# Patient Record
Sex: Male | Born: 1990 | Race: White | Hispanic: No | Marital: Single | Smoking: Current every day smoker
Health system: Southern US, Community
[De-identification: ages and names within clinical notes are randomized; demographics above are authoritative.]

## PROBLEM LIST (undated history)

## (undated) DIAGNOSIS — I10 Essential (primary) hypertension: Secondary | ICD-10-CM

## (undated) HISTORY — PX: DENTAL SURGERY: SHX609

## (undated) HISTORY — PX: ORIF ANKLE FRACTURE: SUR919

---
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
## 2015-01-16 ENCOUNTER — Encounter (HOSPITAL_COMMUNITY): Payer: Self-pay | Admitting: Emergency Medicine

## 2015-01-16 ENCOUNTER — Emergency Department (HOSPITAL_COMMUNITY)
Admission: EM | Admit: 2015-01-16 | Discharge: 2015-01-16 | Disposition: A | Discharge status: Home / Self Care | Payer: BLUE CROSS/BLUE SHIELD | Source: Home / Self Care | Attending: Family Medicine | Admitting: Family Medicine

## 2015-01-16 DIAGNOSIS — S0502XA Injury of conjunctiva and corneal abrasion without foreign body, left eye, initial encounter: Secondary | ICD-10-CM

## 2015-01-16 MED ORDER — TETRACAINE HCL 0.5 % OP SOLN
OPHTHALMIC | Status: AC
  Filled 2015-01-16: qty 2

## 2015-01-16 NOTE — Discharge Instructions (Signed)
It was a pleasure to see you today for the irritation of your left eye.  As we discussed, I believe this is related to irritation/scratch of the cornea (lining of the eye).   No contact lenses for the next 48 hours; you may use an eye patch as needed if it makes your eye feel better.   If the eye irritation or pain gets more severe despite "resting" the eye (without contacts), I recommend further evaluation, most likely with an eye doctor.

## 2015-01-16 NOTE — ED Notes (Signed)
[facility] a [facility] of irritation to the left eye. The patient stated that he feel asleep with his contacts in and when he was able to remove it he said it feels like a scratch.

## 2015-01-16 NOTE — ED Provider Notes (Signed)
CSN: [id number]     Arrival date & time 01/16/15  1303 History   First MD Initiated Contact with Patient 01/16/15 1405     Chief Complaint  Patient presents with  . Eye Problem   (Consider location/radiation/quality/duration/timing/severity/associated sxs/prior Treatment) Patient is a 24 y.o. male presenting with eye problem. The history is provided by the patient. No language interpreter was used.  Eye Problem Patient presents with complaint of left eye irritation. Patient fell asleep last night on sofa while watching TV, left contact lenses in.  Awoke at 5am this morning and removed R contact without incident, but L contact lens removal resulted in irritation to the left eye. In the morning (9am) he awoke with continued irritation and mild L eye photophobia.  Has been using eyeglasses today.  Snellen vision testing with spectacles performed today.   ROS Denies headaches, denies sensation of foreign body sensation in eye. No pain with extra-ocular movement, no diplopia. No visual disturbances while using spectacles.   History reviewed. No pertinent past medical history. No past surgical history on file. History reviewed. No pertinent family history. Social History  Substance Use Topics  . Smoking status: None  . Smokeless tobacco: None  . Alcohol Use: None    Review of Systems  Allergies  Review of patient's allergies indicates no known allergies.  Home Medications   Prior to Admission medications   Not on File   Meds Ordered and Administered this Visit  Medications - No data to display  BP 118/73 mmHg  Pulse 69  Temp(Src) 98 F (36.7 C) (Oral)  Resp 18  SpO2 98% No data found.   Physical Exam  Constitutional: He appears well-developed and well-nourished.  Eyes: EOM are normal. Pupils are equal, round, and reactive to light. Right eye exhibits no discharge. Left eye exhibits no discharge. No scleral icterus.  EOMI.  Mild erythema of conjunctiva OS.   No fb  visualized in eye with inspection.   Fluorescein eye exam performed, mild corneal defect in superior nasal aspect of eye.    Neck: Normal range of motion. Neck supple.    ED Course  Procedures (including critical care time)  Labs Review Labs Reviewed - No data to display  Imaging Review No results found.   Visual Acuity Review  Right Eye Distance: 20/20 (With Corrective lenses) Left Eye Distance: 20/25 (With Corrective lenses) Bilateral Distance: 20/20 (With Corrective lenses)  Right Eye Near:   Left Eye Near:    Bilateral Near:         MDM   1. Injury of conjunctiva and corneal abrasion of left eye without foreign body, initial encounter    OS corneal abrasion without evidence of foreign body. Relief with tetracaine, which was administered for fluorescein exam. Plan to avoid contact lens use and rest eye for the next 48 hours. If worsening irritation/pain, or worsening vision with spectacles, to seek further evaluation with eyecare specialist.  Patient voices understanding.     Barbaraann BarthelJames O August Longest, MD 01/16/15 [phone]

## 2016-10-29 ENCOUNTER — Emergency Department (HOSPITAL_COMMUNITY)
Admission: EM | Admit: 2016-10-29 | Discharge: 2016-10-29 | Disposition: A | Discharge status: Home / Self Care | Payer: 59 | Attending: Emergency Medicine | Admitting: Emergency Medicine

## 2016-10-29 ENCOUNTER — Encounter (HOSPITAL_COMMUNITY): Payer: Self-pay | Admitting: *Deleted

## 2016-10-29 DIAGNOSIS — Y92099 Unspecified place in other non-institutional residence as the place of occurrence of the external cause: Secondary | ICD-10-CM | POA: Diagnosis not present

## 2016-10-29 DIAGNOSIS — F17210 Nicotine dependence, cigarettes, uncomplicated: Secondary | ICD-10-CM | POA: Insufficient documentation

## 2016-10-29 DIAGNOSIS — R262 Difficulty in walking, not elsewhere classified: Secondary | ICD-10-CM | POA: Insufficient documentation

## 2016-10-29 DIAGNOSIS — W2203XA Walked into furniture, initial encounter: Secondary | ICD-10-CM | POA: Diagnosis not present

## 2016-10-29 DIAGNOSIS — Y998 Other external cause status: Secondary | ICD-10-CM | POA: Insufficient documentation

## 2016-10-29 DIAGNOSIS — S70921A Unspecified superficial injury of right thigh, initial encounter: Secondary | ICD-10-CM | POA: Diagnosis present

## 2016-10-29 DIAGNOSIS — S7011XA Contusion of right thigh, initial encounter: Secondary | ICD-10-CM | POA: Diagnosis not present

## 2016-10-29 DIAGNOSIS — S7010XA Contusion of unspecified thigh, initial encounter: Secondary | ICD-10-CM

## 2016-10-29 DIAGNOSIS — Y9301 Activity, walking, marching and hiking: Secondary | ICD-10-CM | POA: Insufficient documentation

## 2016-10-29 LAB — COMPREHENSIVE METABOLIC PANEL
ALK PHOS: 55 U/L (ref 38–126)
ALT: 14 U/L — AB (ref 17–63)
AST: 21 U/L (ref 15–41)
Albumin: 4.2 g/dL (ref 3.5–5.0)
Anion gap: 7 (ref 5–15)
BUN: 9 mg/dL (ref 6–20)
CALCIUM: 9.2 mg/dL (ref 8.9–10.3)
CO2: 25 mmol/L (ref 22–32)
CREATININE: 0.88 mg/dL (ref 0.61–1.24)
Chloride: 109 mmol/L (ref 101–111)
Glucose, Bld: 99 mg/dL (ref 65–99)
Potassium: 4 mmol/L (ref 3.5–5.1)
SODIUM: 141 mmol/L (ref 135–145)
Total Bilirubin: 1.2 mg/dL (ref 0.3–1.2)
Total Protein: 7.9 g/dL (ref 6.5–8.1)

## 2016-10-29 LAB — CBC WITH DIFFERENTIAL/PLATELET
Basophils Absolute: 0 10*3/uL (ref 0.0–0.1)
Basophils Relative: 0 %
Eosinophils Absolute: 0.1 10*3/uL (ref 0.0–0.7)
Eosinophils Relative: 2 %
HCT: 39.5 % (ref 39.0–52.0)
HEMOGLOBIN: 14.3 g/dL (ref 13.0–17.0)
LYMPHS PCT: 27 %
Lymphs Abs: 1.9 10*3/uL (ref 0.7–4.0)
MCH: 30.8 pg (ref 26.0–34.0)
MCHC: 36.2 g/dL — ABNORMAL HIGH (ref 30.0–36.0)
MCV: 85.1 fL (ref 78.0–100.0)
Monocytes Absolute: 0.6 10*3/uL (ref 0.1–1.0)
Monocytes Relative: 9 %
NEUTROS PCT: 62 %
Neutro Abs: 4.4 10*3/uL (ref 1.7–7.7)
Platelets: 188 10*3/uL (ref 150–400)
RBC: 4.64 MIL/uL (ref 4.22–5.81)
RDW: 12.1 % (ref 11.5–15.5)
WBC: 7 10*3/uL (ref 4.0–10.5)

## 2016-10-29 LAB — PROTIME-INR
INR: 1.17
Prothrombin Time: 14.8 seconds (ref 11.4–15.2)

## 2016-10-29 MED ORDER — TRAMADOL HCL 50 MG PO TABS
50.0000 mg | ORAL_TABLET | Freq: Once | ORAL | Status: AC
  Administered 2016-10-29: 50 mg via ORAL
  Filled 2016-10-29: qty 1

## 2016-10-29 MED ORDER — BACITRACIN ZINC 500 UNIT/GM EX OINT
1.0000 "application " | TOPICAL_OINTMENT | Freq: Once | CUTANEOUS | Status: AC
  Administered 2016-10-29: 1 via TOPICAL
  Filled 2016-10-29: qty 0.9

## 2016-10-29 NOTE — ED Provider Notes (Signed)
[facility]-EMERGENCY DEPT Provider Note   CSN: [id number] Arrival date & time: 10/29/16  1010     History   Chief Complaint Chief Complaint  Patient presents with  . Leg Injury  . Wound Check    HPI  Blood pressure 132/86, pulse 86, temperature 98.5 F (36.9 C), temperature source Oral, resp. rate 18, SpO2 99 %.  Gary Sanders is a 26 y.o. male complaining of the right thigh pain which she noticed upon waking this morning. Patient was drinking heavily last evening (8 beers and several shots, he states her member walk walking by the couch at his home and feeling a distinct pain. When he woke up this morning he noticed a large bruise to the anterior proximal thigh and he states that the pain is so severe that he is having difficulty bearing weight. No pain medication taken prior to arrival. He denies any pain in the hip or knee joints. He states that he can fully move all the joints without issue. Last tetanus shot is unknown: he declines any shots here today.   History reviewed. No pertinent past medical history.  There are no active problems to display for this patient.   History reviewed. No pertinent surgical history.     Home Medications    Prior to Admission medications   Not on File    Family History No family history on file.  Social History Social History  Substance Use Topics  . Smoking status: Current Every Day Smoker  . Smokeless tobacco: Never Used  . Alcohol use Yes     Comment: occ     Allergies   Amoxicillin   Review of Systems Review of Systems  A complete review of systems was obtained and all systems are negative except as noted in the HPI and PMH.   Physical Exam Updated Vital Signs BP 128/73   Pulse 70   Temp 98 F (36.7 C) (Oral)   Resp 16   SpO2 97%   Physical Exam  Constitutional: He is oriented to person, place, and time. He appears well-developed and well-nourished. No distress.  HENT:  Head: Normocephalic and atraumatic.    Mouth/Throat: Oropharynx is clear and moist.  Eyes: Pupils are equal, round, and reactive to light. Conjunctivae and EOM are normal.  Neck: Normal range of motion.  Cardiovascular: Normal rate, regular rhythm and intact distal pulses.   Pulmonary/Chest: Effort normal and breath sounds normal.  Abdominal: Soft. There is no tenderness.  Musculoskeletal: Normal range of motion. He exhibits tenderness.  Small area of ecchymosis to right anterior thigh as pictured. There is a puncture wound centrally located. Compartments soft, distally neurovascularly intact with DP and PT pulses 2+, excellent range of motion and sensation to toes. There is no mottling to the distal extremity.  Neurological: He is alert and oriented to person, place, and time. No cranial nerve deficit.  Skin: He is not diaphoretic.  Psychiatric: He has a normal mood and affect.  Nursing note and vitals reviewed.          ED Treatments / Results  Labs (all labs ordered are listed, but only abnormal results are displayed) Labs Reviewed  CBC WITH DIFFERENTIAL/PLATELET - Abnormal; Notable for the following:       Result Value   MCHC 36.2 (*)    All other components within normal limits  COMPREHENSIVE METABOLIC PANEL - Abnormal; Notable for the following:    ALT 14 (*)    All other components within normal limits  PROTIME-INR    EKG  EKG Interpretation None       Radiology No results found.  Procedures Procedures (including critical care time)  Medications Ordered in ED Medications  bacitracin ointment 1 application (1 application Topical Given by Other 10/29/16 1317)  traMADol (ULTRAM) tablet 50 mg (50 mg Oral Given 10/29/16 1317)     Initial Impression / Assessment and Plan / ED Course  I have reviewed the triage vital signs and the nursing notes.  Pertinent labs & imaging results that were available during my care of the patient were reviewed by me and considered in my medical decision making (see  chart for details).     Vitals:   10/29/16 1124 10/29/16 1430  BP: 132/86 128/73  Pulse: 86 70  Resp: 18 16  Temp: 98.5 F (36.9 C) 98 F (36.7 C)  TempSrc: Oral Oral  SpO2: 99% 97%    Medications  bacitracin ointment 1 application (1 application Topical Given by Other 10/29/16 1317)  traMADol (ULTRAM) tablet 50 mg (50 mg Oral Given 10/29/16 1317)    [name] is 26 y.o. male presenting with painful ecchymoses to right anterior thigh, triage note says that there is mottling to the extremity however this is in accurate, CP photographs and physical exam documentation. Patient with excellent distal pulses, compartments are soft. Very mild bruising to the anterior thigh. Blood work with no significant abnormality, patient ambulatory but with mild pain. Advised him on how to treat ecchymoses and we've had an extensive discussion of return precautions  Evaluation does not show pathology that would require ongoing emergent intervention or inpatient treatment. Pt is hemodynamically stable and mentating appropriately. Discussed findings and plan with patient/guardian, who agrees with care plan. All questions answered. Return precautions discussed and outpatient follow up given.      Final Clinical Impressions(s) / ED Diagnoses   Final diagnoses:  Contusion of anterior thigh, initial encounter    New Prescriptions There are no discharge medications for this patient.    Kaylyn Lim 10/29/16 1535    Loren Racer, MD 11/05/16 [phone]

## 2016-10-29 NOTE — ED Triage Notes (Signed)
Pt is here with right upper thigh pain and states he was in his house last nite and felt something hit his leg.  No bleeding seen he said and no gunshots heard.  Pt has large bruise to right upper thigh with a black dot in the middle and extremely tender to touch and states he cannot stand on his leg.  Right foot appears mottled, but has a palpable pulse.

## 2016-10-29 NOTE — Discharge Instructions (Signed)
Apply ice packs to the bruised area for the first 48 hours, after that you can transition to heat, this will help to break up the blood collection.  For pain control please take ibuprofen (also known as Motrin or Advil)  (this is normally 4 over the counter pills) 3 times a day  for 5 days. Take with food to minimize stomach irritation.  Please follow with your primary care doctor in the next 2 days for a check-up. They must obtain records for further management.   Do not hesitate to return to the Emergency Department for any new, worsening or concerning symptoms.

## 2023-11-04 ENCOUNTER — Ambulatory Visit (HOSPITAL_COMMUNITY)
Admission: EM | Admit: 2023-11-04 | Discharge: 2023-11-05 | Disposition: A | Discharge status: Other Acute Inpatient Hospital | Attending: Physician Assistant | Admitting: Physician Assistant

## 2023-11-04 DIAGNOSIS — F22 Delusional disorders: Secondary | ICD-10-CM | POA: Insufficient documentation

## 2023-11-04 DIAGNOSIS — Z046 Encounter for general psychiatric examination, requested by authority: Secondary | ICD-10-CM

## 2023-11-04 DIAGNOSIS — R462 Strange and inexplicable behavior: Secondary | ICD-10-CM

## 2023-11-04 LAB — CBC WITH DIFFERENTIAL/PLATELET
Abs Immature Granulocytes: 0.03 K/uL (ref 0.00–0.07)
Basophils Absolute: 0.1 K/uL (ref 0.0–0.1)
Basophils Relative: 1 %
Eosinophils Absolute: 0.1 K/uL (ref 0.0–0.5)
Eosinophils Relative: 1 %
HCT: 38.6 % — ABNORMAL LOW (ref 39.0–52.0)
Hemoglobin: 14.3 g/dL (ref 13.0–17.0)
Immature Granulocytes: 0 %
Lymphocytes Relative: 19 %
Lymphs Abs: 1.6 K/uL (ref 0.7–4.0)
MCH: 31.2 pg (ref 26.0–34.0)
MCHC: 37 g/dL — ABNORMAL HIGH (ref 30.0–36.0)
MCV: 84.3 fL (ref 80.0–100.0)
Monocytes Absolute: 0.5 K/uL (ref 0.1–1.0)
Monocytes Relative: 6 %
Neutro Abs: 6.1 K/uL (ref 1.7–7.7)
Neutrophils Relative %: 73 %
Platelets: 210 K/uL (ref 150–400)
RBC: 4.58 MIL/uL (ref 4.22–5.81)
RDW: 11.6 % (ref 11.5–15.5)
WBC: 8.2 K/uL (ref 4.0–10.5)
nRBC: 0 % (ref 0.0–0.2)

## 2023-11-04 LAB — HEMOGLOBIN A1C
Hgb A1c MFr Bld: 4.6 % — ABNORMAL LOW (ref 4.8–5.6)
Mean Plasma Glucose: 85.32 mg/dL

## 2023-11-04 LAB — LIPID PANEL
Cholesterol: 140 mg/dL (ref 0–200)
HDL: 51 mg/dL (ref >40)
LDL Cholesterol: 84 mg/dL (ref 0–99)
Total CHOL/HDL Ratio: 2.7 ratio
Triglycerides: 26 mg/dL (ref <150)
VLDL: 5 mg/dL (ref 0–40)

## 2023-11-04 LAB — POCT URINE DRUG SCREEN - MANUAL ENTRY (I-SCREEN)
POC Amphetamine UR: NOT DETECTED
POC Buprenorphine (BUP): NOT DETECTED
POC Cocaine UR: NOT DETECTED
POC Marijuana UR: POSITIVE — AB
POC Methadone UR: NOT DETECTED
POC Methamphetamine UR: NOT DETECTED
POC Morphine: NOT DETECTED
POC Oxazepam (BZO): NOT DETECTED
POC Oxycodone UR: NOT DETECTED
POC Secobarbital (BAR): NOT DETECTED

## 2023-11-04 LAB — ETHANOL: Alcohol, Ethyl (B): <15 mg/dL (ref <15)

## 2023-11-04 LAB — COMPREHENSIVE METABOLIC PANEL WITH GFR
ALT: 14 U/L (ref 0–44)
AST: 16 U/L (ref 15–41)
Albumin: 4.5 g/dL (ref 3.5–5.0)
Alkaline Phosphatase: 37 U/L — ABNORMAL LOW (ref 38–126)
Anion gap: 16 — ABNORMAL HIGH (ref 5–15)
BUN: 15 mg/dL (ref 6–20)
CO2: 22 mmol/L (ref 22–32)
Calcium: 9.6 mg/dL (ref 8.9–10.3)
Chloride: 99 mmol/L (ref 98–111)
Creatinine, Ser: 1.17 mg/dL (ref 0.61–1.24)
GFR, Estimated: >60 mL/min (ref >60)
Glucose, Bld: 83 mg/dL (ref 70–99)
Potassium: 4.3 mmol/L (ref 3.5–5.1)
Sodium: 137 mmol/L (ref 135–145)
Total Bilirubin: 1.4 mg/dL — ABNORMAL HIGH (ref 0.0–1.2)
Total Protein: 6.9 g/dL (ref 6.5–8.1)

## 2023-11-04 LAB — TSH: TSH: 1.644 u[IU]/mL (ref 0.350–4.500)

## 2023-11-04 MED ORDER — HYDROXYZINE HCL 25 MG PO TABS: 25.0000 mg | ORAL_TABLET | Freq: Three times a day (TID) | ORAL | Status: DC | PRN

## 2023-11-04 MED ORDER — HALOPERIDOL 5 MG PO TABS
5.0000 mg | ORAL_TABLET | Freq: Three times a day (TID) | ORAL | Status: DC | PRN
  Filled 2023-11-04: qty 1

## 2023-11-04 MED ORDER — ACETAMINOPHEN 325 MG PO TABS: 650.0000 mg | ORAL_TABLET | Freq: Four times a day (QID) | ORAL | Status: DC | PRN

## 2023-11-04 MED ORDER — MAGNESIUM HYDROXIDE 400 MG/5ML PO SUSP: 30.0000 mL | Freq: Every day | ORAL | Status: DC | PRN

## 2023-11-04 MED ORDER — LORAZEPAM 2 MG/ML IJ SOLN: 2.0000 mg | Freq: Three times a day (TID) | INTRAMUSCULAR | Status: DC | PRN

## 2023-11-04 MED ORDER — HALOPERIDOL LACTATE 5 MG/ML IJ SOLN: 10.0000 mg | Freq: Three times a day (TID) | INTRAMUSCULAR | Status: DC | PRN

## 2023-11-04 MED ORDER — DIPHENHYDRAMINE HCL 50 MG/ML IJ SOLN: 50.0000 mg | Freq: Three times a day (TID) | INTRAMUSCULAR | Status: DC | PRN

## 2023-11-04 MED ORDER — TRAZODONE HCL 50 MG PO TABS
50.0000 mg | ORAL_TABLET | Freq: Every evening | ORAL | Status: DC | PRN
  Administered 2023-11-04: 50 mg via ORAL
  Filled 2023-11-04: qty 1

## 2023-11-04 MED ORDER — ALUM & MAG HYDROXIDE-SIMETH 200-200-20 MG/5ML PO SUSP: 30.0000 mL | ORAL | Status: DC | PRN

## 2023-11-04 MED ORDER — DIPHENHYDRAMINE HCL 50 MG PO CAPS
50.0000 mg | ORAL_CAPSULE | Freq: Three times a day (TID) | ORAL | Status: DC | PRN
  Filled 2023-11-04: qty 1

## 2023-11-04 MED ORDER — HALOPERIDOL LACTATE 5 MG/ML IJ SOLN: 5.0000 mg | Freq: Three times a day (TID) | INTRAMUSCULAR | Status: DC | PRN

## 2023-11-04 NOTE — ED Notes (Signed)
 Patient IVC oriented to unit.  Patient initially anxious and hesitant about lying in bed.  Staff offered support and encouragement.  Patient receptive.  Patient currently endorses SI but agrees to contract.  No HI/AVH.  Q15 minute checks performed for patient safety.

## 2023-11-04 NOTE — ED Provider Notes (Signed)
 [facility] Continuous Assessment Admission H&P  Date: 11/04/23 Patient Name: Gary Sanders MRN: [id number] Chief Complaint: IVC by mother and step mother  Diagnoses:  Final diagnoses:  Involuntary commitment  Bizarre behavior    HPI:   Gary Sanders is a 33 year old male with a past psychiatric history significant for depression who presents to [facility] involuntarily by [employer].  Patient reports that he was IVC'd by his mother and step mother.  Patient reports that his mother and stepmother did not tell him why he was being committed.  When asked how he was behaving prior to his admission, patient reported that he is not exhibiting any behavior beyond his true nature.  He reports that some of the behaviors that he was exhibiting today do not align with who he is.  He reports that he was out of character prior to being picked up by [employer].  He reports that he was not calm and composed prior to being brought in and was acting erratically.  Patient denies acting in a way that was a cause for concern.  Patient affidavit and petition reads the following:  RESPONDENT IS TALKING IN THIRD PERSON. SPEAKING ABOUT JUMPING OFF A BALCONY AND BANGING HIS HEAD AGAINST A WINDOW AND GLASS. TELLING SOMEONE WHO IS NOT THERE TO STOP TALKING TO HIM. HAS HAD SHAKES, TURNED PALE, SWEATING, PASSED OUT, AND THEN JUMPED UP FROM BEING PASSED OUT DURING THE DAY. TOLD FAMILY HIS BRACELET IS ON FIRE BUT IT IS NOT. ACTING AS IF HE IS HOLDING A TEACUP STRAIGHT OUT IN HIS HAND TALKING TO SOMEONE. IN THIRD PERSON IS SPEAKING OF TAKING HIS EYES INTO THE KNIFE.  Patient denies depression but does endorse anxiety due to schoolwork and assignments that he has to do.  Patient denies paranoia and further denies auditory or visual hallucinations.  Patient denies any other issues or concerns at this time.  Patient endorses a past history of depression and states that he was placed on sertraline and  trazodone in 2021.  He reports that he took sertraline until fall of 2023 when he was evaluated [facility].  He reports that his primary care provider encouraged him to see a counselor and once he started seeing a counselor, patient was taken off his sertraline.  Patient reports that he has felt better ever since coming off her sertraline.  Patient reports that he still receives trazodone by his primary care provider.  Patient denies a past history of hospitalization due to mental health.  Patient denies a past history of suicide attempt.  Patient denies a past history of self-harm.  Patient is alert oriented x 4, calm, cooperative, and engaged in conversation during the encounter.  Patient's speech is clear, coherent, and with normal rate.  Patient maintains fair eye contact.  Patient's thought process is coherent.  Patient's thought content is delusional per IVC paperwork.  Patient exhibits anxious mood with blunted affect.  Patient denies suicidal or homicidal ideations.  He further denies auditory or visual hallucinations and does not appear to be responding to internal/external stimuli.  Patient endorses good sleep and receives on average 6 to 8 hours of sleep per night.  Patient endorses decreased appetite and eats on average 1 meal per day.  Patient denies alcohol consumption, tobacco use, or illicit drug use.  He reports that he does receive THC-A from online.  Total Time spent with patient: 30 minutes  Musculoskeletal  Strength &  Muscle Tone: within normal limits Gait & Station: normal Patient leans: N/A  Psychiatric Specialty Exam  Presentation General Appearance:  Casual  Eye Contact: Fair  Speech: Clear and Coherent; Normal Rate  Speech Volume: Normal  Handedness: -- (Not applicable)   Mood and Affect  Mood: Anxious; Dysphoric  Affect: Blunt   Thought Process  Thought Processes: Coherent  Descriptions of  Associations:Intact  Orientation:Full (Time, Place and Person)  Thought Content:Delusions (Per IVC paperwork, patient was talking in third person.  He was telling someone who was not there to stop talking to him.)  Diagnosis of Schizophrenia or Schizoaffective disorder in past: No  Duration of Psychotic Symptoms: Less than six months  Hallucinations:Hallucinations: Auditory Description of Auditory Hallucinations: Per IVC paperwork, patient was telling someone who is not there to stop talking to him.  Ideas of Reference:Delusions (Per IVC paperwork, patient was talking in third person. He was telling someone who was not there to stop talking to him.)  Suicidal Thoughts:Suicidal Thoughts: Yes, Active (Patient reports that he was speaking about jumping off a balcony and banged his head against the window and glass.) SI Active Intent and/or Plan: With Intent; With Plan  Homicidal Thoughts:Homicidal Thoughts: No   Sensorium  Memory: Immediate Fair; Remote Fair; Recent Fair  Judgment: Impaired  Insight: Lacking   Executive Functions  Concentration: Fair  Attention Span: Fair  Recall: Fair  Fund of Knowledge: Fair  Language: Good   Psychomotor Activity  Psychomotor Activity: Psychomotor Activity: Normal   Assets  Assets: Communication Skills; Housing   Sleep  Sleep: Sleep: Good Number of Hours of Sleep: 7   Nutritional Assessment (For OBS and [facility] admissions only) Has the patient had a weight loss or gain of 10 pounds or more in the last 3 months?: No Has the patient had a decrease in food intake/or appetite?: Yes Does the patient have dental problems?: No Does the patient have eating habits or behaviors that may be indicators of an eating disorder including binging or inducing vomiting?: No Has the patient recently lost weight without trying?: 0 Has the patient been eating poorly because of a decreased appetite?: 0 Malnutrition Screening Tool Score:  0    Physical Exam Constitutional:      Appearance: Normal appearance.  HENT:     Head: Normocephalic and atraumatic.     Nose: Nose normal.  Eyes:     Extraocular Movements: Extraocular movements intact.  Cardiovascular:     Rate and Rhythm: Normal rate and regular rhythm.  Pulmonary:     Effort: Pulmonary effort is normal.  Musculoskeletal:        General: Normal range of motion.     Cervical back: Normal range of motion.  Skin:    General: Skin is warm and dry.  Neurological:     Mental Status: He is alert.  Psychiatric:        Attention and Perception: Attention normal. He does not perceive auditory or visual hallucinations.        Mood and Affect: Mood is anxious. Affect is blunt.        Speech: Speech normal.        Behavior: Behavior normal. Behavior is cooperative.        Thought Content: Thought content is not paranoid or delusional. Thought content does not include homicidal or suicidal ideation.        Judgment: Judgment is inappropriate.     Comments: Per IVC paperwork, patient was talking in third person and telling  someone who is not there to stop talking to him.  Prior to his admission, patient was observed speaking about jumping off a balcony and breaking his head against a window and glass.    Review of Systems  Constitutional: Negative.   HENT: Negative.    Eyes: Negative.   Respiratory: Negative.    Cardiovascular: Negative.   Gastrointestinal: Negative.   Skin: Negative.   Neurological: Negative.   Psychiatric/Behavioral:  Negative for depression, hallucinations, substance abuse and suicidal ideas. The patient is nervous/anxious. The patient does not have insomnia.     Blood pressure 132/84, pulse 88, temperature 98.8 F (37.1 C), temperature source Oral, height 5' 11 (1.803 m), weight 205 lb (93 kg), SpO2 97%. Body mass index is 28.59 kg/m.  Past Psychiatric History:   Patient has a past history of major depressive disorder (severe episode,  recurrent, without psychotic features) and adjustment disorder  Is the patient at risk to self? Yes  Has the patient been a risk to self in the past 6 months? No .    Has the patient been a risk to self within the distant past? No   Is the patient a risk to others? No   Has the patient been a risk to others in the past 6 months? No   Has the patient been a risk to others within the distant past? No   Past Medical History:  Unknown  Family History:  Unknown  Social History:  Not applicable  Last Labs:  No visits with results within 6 Month(s) from this visit.  Latest known visit with results is:  Admission on 10/29/2016, Discharged on 10/29/2016  Component Date Value Ref Range Status   WBC 10/29/2016 7.0  4.0 - 10.5 K/uL Final   RBC 10/29/2016 4.64  4.22 - 5.81 MIL/uL Final   Hemoglobin 10/29/2016 14.3  13.0 - 17.0 g/dL Final   HCT 90/76/7981 39.5  39.0 - 52.0 % Final   MCV 10/29/2016 85.1  78.0 - 100.0 fL Final   MCH 10/29/2016 30.8  26.0 - 34.0 pg Final   MCHC 10/29/2016 36.2 (H)  30.0 - 36.0 g/dL Final   RDW 90/76/7981 12.1  11.5 - 15.5 % Final   Platelets 10/29/2016 188  150 - 400 K/uL Final   Neutrophils Relative % 10/29/2016 62  % Final   Neutro Abs 10/29/2016 4.4  1.7 - 7.7 K/uL Final   Lymphocytes Relative 10/29/2016 27  % Final   Lymphs Abs 10/29/2016 1.9  0.7 - 4.0 K/uL Final   Monocytes Relative 10/29/2016 9  % Final   Monocytes Absolute 10/29/2016 0.6  0.1 - 1.0 K/uL Final   Eosinophils Relative 10/29/2016 2  % Final   Eosinophils Absolute 10/29/2016 0.1  0.0 - 0.7 K/uL Final   Basophils Relative 10/29/2016 0  % Final   Basophils Absolute 10/29/2016 0.0  0.0 - 0.1 K/uL Final   Sodium 10/29/2016 141  135 - 145 mmol/L Final   Potassium 10/29/2016 4.0  3.5 - 5.1 mmol/L Final   Chloride 10/29/2016 109  101 - 111 mmol/L Final   CO2 10/29/2016 25  22 - 32 mmol/L Final   Glucose, Bld 10/29/2016 99  65 - 99 mg/dL Final   BUN 90/76/7981 9  6 - 20 mg/dL Final    Creatinine, Ser 10/29/2016 0.88  0.61 - 1.24 mg/dL Final   Calcium 90/76/7981 9.2  8.9 - 10.3 mg/dL Final   Total Protein 90/76/7981 7.9  6.5 - 8.1 g/dL Final  Albumin 10/29/2016 4.2  3.5 - 5.0 g/dL Final   AST 90/76/7981 21  15 - 41 U/L Final   ALT 10/29/2016 14 (L)  17 - 63 U/L Final   Alkaline Phosphatase 10/29/2016 55  38 - 126 U/L Final   Total Bilirubin 10/29/2016 1.2  0.3 - 1.2 mg/dL Final   GFR calc non Af Amer 10/29/2016 >60  >60 mL/min Final   GFR calc Af Amer 10/29/2016 >60  >60 mL/min Final   Comment: (NOTE) The eGFR has been calculated using the CKD EPI equation. This calculation has not been validated in all clinical situations. eGFR's persistently <60 mL/min signify possible Chronic Kidney Disease.    Anion gap 10/29/2016 7  5 - 15 Final   Prothrombin Time 10/29/2016 14.8  11.4 - 15.2 seconds Final   INR 10/29/2016 1.17   Final    Allergies: Amoxicillin  Medications:  Facility Ordered Medications  Medication   acetaminophen (TYLENOL) tablet 650 mg   alum & mag hydroxide-simeth (MAALOX/MYLANTA) 200-200-20 MG/5ML suspension 30 mL   magnesium hydroxide (MILK OF MAGNESIA) suspension 30 mL   haloperidol (HALDOL) tablet 5 mg   And   diphenhydrAMINE (BENADRYL) capsule 50 mg   haloperidol lactate (HALDOL) injection 5 mg   And   diphenhydrAMINE (BENADRYL) injection 50 mg   And   LORazepam (ATIVAN) injection 2 mg   haloperidol lactate (HALDOL) injection 10 mg   And   diphenhydrAMINE (BENADRYL) injection 50 mg   And   LORazepam (ATIVAN) injection 2 mg   hydrOXYzine (ATARAX) tablet 25 mg   traZODone (DESYREL) tablet 50 mg      Medical Decision Making  Patient was brought into the facility involuntarily by [employer].  Patient reports that he was committed by his mother and stepmother.  When asked why he was admitted, patient reports that he was acting erratically and was not calm or composed.  Patient did not mention any of the details reported in the affidavit and  petition forms.  Per affidavit:  RESPONDENT IS TALKING IN THIRD PERSON. SPEAKING ABOUT JUMPING OFF A BALCONY AND BANGING HIS HEAD AGAINST A WINDOW AND GLASS. TELLING SOMEONE WHO IS NOT THERE TO STOP TALKING TO HIM. HAS HAD SHAKES, TURNED PALE, SWEATING, PASSED OUT, AND THEN JUMPED UP FROM BEING PASSED OUT DURING THE DAY. TOLD FAMILY HIS BRACELET IS ON FIRE BUT IT IS NOT. ACTING AS IF HE IS HOLDING A TEACUP STRAIGHT OUT IN HIS HAND TALKING TO SOMEONE. IN THIRD PERSON IS SPEAKING OF TAKING HIS EYES INTO THE KNIFE.  Based on my evaluation of the patient and what was read in the IVC paperwork, provider recommends inpatient psychiatry for the patient.  Prior to being admitted onto the unit, provider to order and initiate admission labs.  Recommendations  Based on my evaluation the patient does not appear to have an emergency medical condition. Disposition: Inpatient psychiatry  Reginia FORBES Bolster, [place] 11/04/23  9:49 PM

## 2023-11-04 NOTE — ED Notes (Signed)
D: Pt resting, eyes closed, respirations even and unlabored. No distress noted. A: Continue Q 15 min checks for safety. R: Pt remains safe on the unit.   

## 2023-11-05 ENCOUNTER — Other Ambulatory Visit: Payer: Self-pay

## 2023-11-05 ENCOUNTER — Inpatient Hospital Stay (HOSPITAL_COMMUNITY)
Admission: AD | Admit: 2023-11-05 | Discharge: 2023-11-11 | DRG: 885 | Disposition: A | Discharge status: Home / Self Care | Source: Intra-hospital

## 2023-11-05 ENCOUNTER — Encounter (HOSPITAL_COMMUNITY): Payer: Self-pay

## 2023-11-05 DIAGNOSIS — F29 Unspecified psychosis not due to a substance or known physiological condition: Principal | ICD-10-CM | POA: Diagnosis present

## 2023-11-05 DIAGNOSIS — F19950 Other psychoactive substance use, unspecified with psychoactive substance-induced psychotic disorder with delusions: Principal | ICD-10-CM | POA: Diagnosis present

## 2023-11-05 DIAGNOSIS — F1220 Cannabis dependence, uncomplicated: Secondary | ICD-10-CM | POA: Diagnosis not present

## 2023-11-05 DIAGNOSIS — I10 Essential (primary) hypertension: Secondary | ICD-10-CM | POA: Diagnosis present

## 2023-11-05 DIAGNOSIS — F12250 Cannabis dependence with psychotic disorder with delusions: Secondary | ICD-10-CM | POA: Diagnosis present

## 2023-11-05 DIAGNOSIS — F17200 Nicotine dependence, unspecified, uncomplicated: Secondary | ICD-10-CM | POA: Diagnosis present

## 2023-11-05 DIAGNOSIS — F12959 Cannabis use, unspecified with psychotic disorder, unspecified: Secondary | ICD-10-CM

## 2023-11-05 DIAGNOSIS — Z79899 Other long term (current) drug therapy: Secondary | ICD-10-CM | POA: Diagnosis not present

## 2023-11-05 DIAGNOSIS — Z9152 Personal history of nonsuicidal self-harm: Secondary | ICD-10-CM | POA: Diagnosis not present

## 2023-11-05 HISTORY — DX: Cannabis use, unspecified with psychotic disorder, unspecified: F12.959

## 2023-11-05 HISTORY — DX: Essential (primary) hypertension: I10

## 2023-11-05 MED ORDER — HYDROXYZINE HCL 25 MG PO TABS
25.0000 mg | ORAL_TABLET | Freq: Three times a day (TID) | ORAL | Status: DC | PRN
  Filled 2023-11-05 (×2): qty 1

## 2023-11-05 MED ORDER — LORAZEPAM 2 MG/ML IJ SOLN: 2.0000 mg | Freq: Three times a day (TID) | INTRAMUSCULAR | Status: DC | PRN

## 2023-11-05 MED ORDER — SERTRALINE HCL 50 MG PO TABS
50.0000 mg | ORAL_TABLET | Freq: Every day | ORAL | Status: DC
  Filled 2023-11-05: qty 1

## 2023-11-05 MED ORDER — DIPHENHYDRAMINE HCL 50 MG/ML IJ SOLN: 50.0000 mg | Freq: Three times a day (TID) | INTRAMUSCULAR | Status: DC | PRN

## 2023-11-05 MED ORDER — TRAZODONE HCL 50 MG PO TABS
50.0000 mg | ORAL_TABLET | Freq: Every evening | ORAL | Status: DC | PRN
  Administered 2023-11-05: 50 mg via ORAL
  Filled 2023-11-05: qty 1

## 2023-11-05 MED ORDER — QUETIAPINE FUMARATE 50 MG PO TABS
50.0000 mg | ORAL_TABLET | Freq: Two times a day (BID) | ORAL | Status: DC
  Filled 2023-11-05: qty 1

## 2023-11-05 MED ORDER — HALOPERIDOL LACTATE 5 MG/ML IJ SOLN: 10.0000 mg | Freq: Three times a day (TID) | INTRAMUSCULAR | Status: DC | PRN

## 2023-11-05 MED ORDER — LORAZEPAM 0.5 MG PO TABS
0.5000 mg | ORAL_TABLET | Freq: Four times a day (QID) | ORAL | Status: DC | PRN
  Administered 2023-11-05: 0.5 mg via ORAL
  Filled 2023-11-05: qty 1

## 2023-11-05 MED ORDER — DIPHENHYDRAMINE HCL 25 MG PO CAPS: 50.0000 mg | ORAL_CAPSULE | Freq: Three times a day (TID) | ORAL | Status: DC | PRN

## 2023-11-05 MED ORDER — ALUM & MAG HYDROXIDE-SIMETH 200-200-20 MG/5ML PO SUSP: 30.0000 mL | ORAL | Status: DC | PRN

## 2023-11-05 MED ORDER — HALOPERIDOL 5 MG PO TABS: 5.0000 mg | ORAL_TABLET | Freq: Three times a day (TID) | ORAL | Status: DC | PRN

## 2023-11-05 MED ORDER — MAGNESIUM HYDROXIDE 400 MG/5ML PO SUSP: 30.0000 mL | Freq: Every day | ORAL | Status: DC | PRN

## 2023-11-05 MED ORDER — ACETAMINOPHEN 325 MG PO TABS
650.0000 mg | ORAL_TABLET | Freq: Four times a day (QID) | ORAL | Status: DC | PRN
  Administered 2023-11-06 – 2023-11-07 (×3): 650 mg via ORAL
  Filled 2023-11-05 (×3): qty 2

## 2023-11-05 MED ORDER — DIPHENHYDRAMINE HCL 50 MG PO CAPS: 50.0000 mg | ORAL_CAPSULE | Freq: Three times a day (TID) | ORAL | Status: DC | PRN

## 2023-11-05 MED ORDER — HALOPERIDOL LACTATE 5 MG/ML IJ SOLN: 5.0000 mg | Freq: Three times a day (TID) | INTRAMUSCULAR | Status: DC | PRN

## 2023-11-05 NOTE — BH Assessment (Signed)
 Comprehensive Clinical Assessment (CCA) Note   11/05/2023 Gary Sanders [id number]  Disposition: Per [facility],  patient is recommended for inpatient admission.  Disposition SW to pursue appropriate inpatient options.  The patient demonstrates the following risk factors for suicide: Chronic risk factors for suicide include: psychiatric disorder of Major Depressive Disorder. Acute risk factors for suicide include: family or marital conflict. Protective factors for this patient include: positive social support. Considering these factors, the overall suicide risk at this point appears to be moderate. Patient is not appropriate for outpatient follow up.   Patient is a 33 year old male with a history of depression who presents involuntarily to [facility] for an assessment. Patient resides in the home alone and identifies his mother as their primary support system.Patient reports isolation, crying spells, irritability, hopelessness, guilt, loss of interest to do things they enjoy, fatigue, lack of concentration, worthlessness, change in sleep, change in appetite. Patient denies history of past suicide attempts. Patient has a hx of Substance Abuse: THC 8  Last use was earlier today .Patient denies NSSIB, SI, HI, AVH.  According to IVC : RESPONDENT IS TALKING IN THIRD PERSON. SPEAKING ABOUT JUMPING OFF A BALCONY AND BANGING HIS HEAD AGAINST A WINDOW AND GLASS. TELLING SOMEONE WHO IS NOT THERE TO STOP TALKING TO HIM. HAS HAD SHAKES, TURNED PALE, SWEATING, PASSED OUT, AND THEN JUMPED UP FROM BEING PASSED OUT DURING THE DAY. TOLD FAMILY HIS BRACELET IS ON FIRE BUT IT IS NOT. ACTING AS IF HE IS HOLDING A TEACUP STRAIGHT OUT IN HIS HAND TALKING TO SOMEONE. IN THIRD PERSON IS SPEAKING OF TAKING HIS EYES INTO THE KNIFE.   Patient identifies his primary stressors as his relationship with her mother. Patient denies history of abuse or trauma. Patient denies current legal problems. Patient is  not receiving outpatient therapy and psychiatry services. Patient reports he  takes his medications as prescribed (see MAR) and denies recent medication changes. Pt reports that he got off Sertraline two years ago and only takes Trazodone. Patient denies previous inpatient admission.  Patient denies access to weapons.   Patient is able to to contract for safety outside of the hospital.  Patient gives verbal consent for [facility] to contact his mother. Clinician attempted to contact pt's mother twice and did not get a response. Clinician left a HIPAA compliant voice mail message.  During evaluation pt is in no acute distress. He is alert, oriented x 4, calm, cooperative and attentive. his mood is closed / guarded, depressed, and flat with congruent affect. He has normal speech, and behavior.  Objectively there is no evidence of psychosis/mania or delusional thinking.  Patient is able to converse coherently, goal directed thoughts, no distractibility, or pre-occupation.   He also denies suicidal/self-harm/homicidal ideation, psychosis, and paranoia.  Patient answered question appropriately.       Chief Complaint: IVC  Visit Diagnosis:  Involuntary commitment Bizarre behavior     CCA Screening, Triage and Referral (STR)  Patient Reported Information How did you hear about us ? Legal System  What Is the Reason for Your Visit/Call Today? Pt is a 33 yo male who was brought to [facility] by [facility] involuntarily. Pt reports that he is unsure why his mother put IVC paperwork on him. Pt was poor historian. Pt appeared to have slow speech. Pt denies SI, HI, and AVH. Pt reports hx of depression. Pt reports he only takes trazadone. Pt is currently not active with outpatient services.  How Long Has This Been Causing  You Problems? 1-6 months  What Do You Feel Would Help You the Most Today? Treatment for Depression or other mood problem; Stress Management; Medication(s)   Have You Recently Had Any Thoughts About  Hurting Yourself? No  Are You Planning to Commit Suicide/Harm Yourself At This time? No   Flowsheet Row ED from 11/04/20[facility] in [facility]  C-SSRS RISK CATEGORY High Risk    Have you Recently Had Thoughts About Hurting Someone [place]? No  Are You Planning to Harm Someone at This Time? No  Explanation: Pt denies HI   Have You Used Any Alcohol or Drugs in the Past 24 Hours? Yes  How Long Ago Did You Use Drugs or Alcohol? Earlier today  What Did You Use and How Much? Pt reports using THC 8 . Pt unable to recall how much he uses. Pt reports he uses daily.   Do You Currently Have a Therapist/Psychiatrist? No  Name of Therapist/Psychiatrist:    Have You Been Recently Discharged From Any Office Practice or Programs? No  Explanation of Discharge From Practice/Program: n/a     CCA Screening Triage Referral Assessment Type of Contact: Face-to-Face  Telemedicine Service Delivery:   Is this Initial or Reassessment?   Date Telepsych consult ordered in [facility]:    Time Telepsych consult ordered in [facility]:    Location of Assessment: [facility]  Provider Location: [facility] Assessment Services   Collateral Involvement: None : TTS attempted to contact mother ( IVC petitioner) twice and did not receive a response.   Does Patient Have a Automotive engineer Guardian? No  Legal Guardian Contact Information: n/a  Copy of Legal Guardianship Form: -- (n/a)  Legal Guardian Notified of Arrival: -- (n/a)  Legal Guardian Notified of Pending Discharge: -- (n/a)  If Minor and Not Living with Parent(s), Who has Custody? n/a  Is CPS involved or ever been involved? Never  Is APS involved or ever been involved? Never   Patient Determined To Be At Risk for Harm To Self or Others Based on Review of Patient Reported Information or Presenting Complaint? No  Method: No Plan  Availability of Means: No access or NA  Intent: Vague intent or  NA  Notification Required: No need or identified person  Additional Information for Danger to Others Potential: -- (n/a)  Additional Comments for Danger to Others Potential: n/a  Are There Guns or Other Weapons in Your Home? No  Types of Guns/Weapons: Pt denies access  Are These Weapons Safely Secured?                            Yes  Who Could Verify You Are Able To Have These Secured: Pt denies access  Do You Have any Outstanding Charges, Pending Court Dates, Parole/Probation? Pt denies pending legal charges  Contacted To Inform of Risk of Harm To Self or Others: Other: Comment (n/a)    Does Patient Present under Involuntary Commitment? Yes    [place] of Residence: [place]   Patient Currently Receiving the Following Services: Not Receiving Services   Determination of Need: Urgent (48 hours)   Options For Referral: Inpatient Hospitalization     CCA Biopsychosocial Patient Reported Schizophrenia/Schizoaffective Diagnosis in Past: No   Strengths: None reported   Mental Health Symptoms Depression:  Change in energy/activity; Hopelessness; Worthlessness   Duration of Depressive symptoms: Duration of Depressive Symptoms: Greater than two weeks   Mania:  None  Anxiety:   None   Psychosis:  None   Duration of Psychotic symptoms: Duration of Psychotic Symptoms: N/A   Trauma:  None   Obsessions:  None   Compulsions:  None   Inattention:  None   Hyperactivity/Impulsivity:  None   Oppositional/Defiant Behaviors:  None   Emotional Irregularity:  None   Other Mood/Personality Symptoms:  none reported    Mental Status Exam Appearance and self-care  Stature:  Average   Weight:  Average weight   Clothing:  Casual   Grooming:  Normal   Cosmetic use:  None   Posture/gait:  Normal   Motor activity:  Not Remarkable   Sensorium  Attention:  Confused   Concentration:  Normal   Orientation:  Person; Place   Recall/memory:  Normal    Affect and Mood  Affect:  Depressed; Flat   Mood:  Depressed   Relating  Eye contact:  Staring   Facial expression:  Tense   Attitude toward examiner:  Cooperative   Thought and Language  Speech flow: Blocked   Thought content:  Appropriate to Mood and Circumstances   Preoccupation:  Suicide (Per IVC. Pt currently denies SI)   Hallucinations:  None   Organization:  Circumstantial   Company secretary of Knowledge:  Fair   Intelligence:  Average   Abstraction:  Abstract   Judgement:  Fair   Dance movement psychotherapist:  Adequate   Insight:  Fair   Decision Making:  Impulsive   Social Functioning  Social Maturity:  Impulsive   Social Judgement:  Heedless   Stress  Stressors:  Family conflict   Coping Ability:  Exhausted; Overwhelmed   Skill Deficits:  Activities of daily living   Supports:  Family     Religion: Religion/Spirituality Are You A Religious Person?: No How Might This Affect Treatment?: n/a  Leisure/Recreation: Leisure / Recreation Do You Have Hobbies?: No  Exercise/Diet: Exercise/Diet Do You Exercise?: No Have You Gained or Lost A Significant Amount of Weight in the Past Six Months?: No Do You Follow a Special Diet?: No Do You Have Any Trouble Sleeping?: No   CCA Employment/Education Employment/Work Situation: Employment / Work Situation Employment Situation: Employed Work Stressors: none reported Patient's Job has Been Impacted by Current Illness: No Has Patient ever Been in Equities trader?: No  Education: Education Is Patient Currently Attending School?: No Last Grade Completed: 12 Did You Product manager?: No Did You Have An Individualized Education Program (IIEP): No Did You Have Any Difficulty At [employer]?: No Patient's Education Has Been Impacted by Current Illness: No   CCA Family/Childhood History Family and Relationship History: Family history Marital status: Single Does patient have children?: No  Childhood  History:  Childhood History By whom was/is the patient raised?: Mother Did patient suffer any verbal/emotional/physical/sexual abuse as a child?: No Did patient suffer from severe childhood neglect?: No Has patient ever been sexually abused/assaulted/raped as an adolescent or adult?: No Was the patient ever a victim of a crime or a disaster?: No Witnessed domestic violence?: No Has patient been affected by domestic violence as an adult?: No       CCA Substance Use Alcohol/Drug Use: Alcohol / Drug Use Pain Medications: See MAR Prescriptions: See MAR Over the Counter: See MAR History of alcohol / drug use?: Yes Longest period of sobriety (when/how long): Pt reports using THC 8 daily but unsure of the amount Negative Consequences of Use:  (n/a) Withdrawal Symptoms:  (n/a)  ASAM's:  Six Dimensions of Multidimensional Assessment  Dimension 1:  Acute Intoxication and/or Withdrawal Potential:      Dimension 2:  Biomedical Conditions and Complications:      Dimension 3:  Emotional, Behavioral, or Cognitive Conditions and Complications:     Dimension 4:  Readiness to Change:     Dimension 5:  Relapse, Continued use, or Continued Problem Potential:     Dimension 6:  Recovery/Living Environment:     ASAM Severity Score:    ASAM Recommended Level of Treatment: ASAM Recommended Level of Treatment:  (n/a)   Substance use Disorder (SUD) Substance Use Disorder (SUD)  Checklist Symptoms of Substance Use:  (n/a)  Recommendations for Services/Supports/Treatments: Recommendations for Services/Supports/Treatments Recommendations For Services/Supports/Treatments: Inpatient Hospitalization  Disposition Recommendation per psychiatric provider: We recommend inpatient psychiatric hospitalization after medical hospitalization. Patient has been involuntarily committed on 11/04/23.    DSM5 Diagnoses: There are no active problems to display for this  patient.    Referrals to Alternative Service(s): Referred to Alternative Service(s):   Place:   Date:   Time:    Referred to Alternative Service(s):   Place:   Date:   Time:    Referred to Alternative Service(s):   Place:   Date:   Time:    Referred to Alternative Service(s):   Place:   Date:   Time:     Rosina PARAS, [place], [facility]

## 2023-11-05 NOTE — ED Notes (Signed)
D: Pt resting, eyes closed, respirations even and unlabored. No distress noted. A: Continue Q 15 min checks for safety. R: Pt remains safe on the unit.   

## 2023-11-05 NOTE — Tx Team (Signed)
 Initial Treatment Plan 11/05/2023 6:42 PM Gary Sanders [id number]    PATIENT STRESSORS: Educational concerns     PATIENT STRENGTHS: General fund of knowledge    PATIENT IDENTIFIED PROBLEMS:                      DISCHARGE CRITERIA:  Improved stabilization in mood, thinking, and/or behavior Verbal commitment to aftercare and medication compliance  PRELIMINARY DISCHARGE PLAN: Outpatient therapy  PATIENT/FAMILY INVOLVEMENT: This treatment plan has been presented to and reviewed with the patient, Gary Sanders.  The patient and family have been given the opportunity to ask questions and make suggestions.  Reniah Cottingham M Daleen Steinhaus, RN 11/05/2023, 6:42 PM

## 2023-11-05 NOTE — ED Provider Notes (Signed)
 Care of patient assumed from previous shift, reports of bizarre behaviors on petition taken out by mother as noted in previous provider's documentation. Patient assessed and guarded, minimizing his symptoms, focusing on wanting to be discharged, yet admitting to erratic behaviors prior to hospitalization, stopping medications > a year ago as well as THC abuse. He has poor insight into the fact that his substance use may be negatively impacting his mental status, and is not willing to comply with recommendations for voluntary treatment. Involuntary commitment upheld. First examination completed, Pt Accepted by [facility] and transferred there for treatment and stabilization.

## 2023-11-05 NOTE — ED Notes (Signed)
 [employer] here to transport pt to [facility]. All paperwork and belongings given to [employer].

## 2023-11-05 NOTE — ED Notes (Signed)
 Pt provided w/ scrub bottoms and pillow case

## 2023-11-05 NOTE — Progress Notes (Addendum)
   11/05/23 1712  Psych Admission Type (Psych Patients Only)  Admission Status Involuntary  Psychosocial Assessment  Patient Complaints Agitation;Worrying  Eye Contact Staring  Facial Expression Animated  Affect Irritable  Speech Logical/coherent  Interaction Assertive  Motor Activity Other (Comment) (WDL)  Appearance/Hygiene In scrubs  Behavior Characteristics Agitated  Thought Process  Coherency WDL  Content WDL  Delusions Controlled  Perception WDL  Hallucination None reported or observed  Judgment Impaired  Confusion None  Danger to Self  Agreement Not to Harm Self Yes  Description of Agreement verbal  Danger to Others  Danger to Others None reported or observed   Upon admission, pt reports he is here because  I text my grandad to get pizza. Are you aware of hypnotization? My mind and body got separated. I was hypnotized. Pt reports using THC-A. Pt denies any other drug or alcohol use.

## 2023-11-05 NOTE — ED Notes (Signed)
 patient observed to be highly irritable and increasingly agitated during interaction with this RN. Patient stated, "If I pop off, what are you going to do?" Tone was hostile and challenging. Patient spilled water intentionally on recliner and demanded this RN replace the sheets immediately, showing little patience and disregard for redirection. Patient verbalized, "I need an MD to evaluate me and not an NP," in a loud, confrontational manner. Despite multiple attempts at redirection, patient remained visibly agitated, restless, and uncooperative with staff interventions.np Doris notified

## 2023-11-05 NOTE — Progress Notes (Signed)
 Pt has been accepted to [facility] on 11/05/2023 Bed assignment: 403-01  Pt meets inpatient criteria per: Donia Snell NP  Attending Physician will be: Dr.Pashayan MD  Report can be called un:lwpu: Adult unit: [phone]  Pt can arrive after [facility] WILL UPDATE   Care Team Notified: [facility]. [facility] Cherylynn Ernst RN, Donia Snell NP, Damien Fireman RN  [place] Osha Rane LCSW-A   11/05/2023 1:31 PM

## 2023-11-05 NOTE — ED Notes (Signed)
 Pt A&Ox4, irritable & cooperative and in NAD at this time. Denies SI/HI/AVH. Contracts for safety. Encouragement and support given. Will continue to monitor.

## 2023-11-05 NOTE — ED Notes (Signed)
 Pt A&Ox4, sitting/laying on recliner/bed watching TV in NAD. Calm and cooperative. Will continue to monitor.

## 2023-11-05 NOTE — Group Note (Deleted)
 Date:  11/05/2023 Time:  8:44 PM  Group Topic/Focus:  Wrap-Up Group:   The focus of this group is to help patients review their daily goal of treatment and discuss progress on daily workbooks.     Participation Level:  {BHH PARTICIPATION OZCZO:77735}  Participation Quality:  {BHH PARTICIPATION QUALITY:22265}  Affect:  {BHH AFFECT:22266}  Cognitive:  {BHH COGNITIVE:22267}  Insight: {BHH Insight2:20797}  Engagement in Group:  {BHH ENGAGEMENT IN HMNLE:77731}  Modes of Intervention:  {BHH MODES OF INTERVENTION:22269}  Additional Comments:  ***  Patrisha Hausmann A Creed Kail 11/05/2023, 8:44 PM

## 2023-11-05 NOTE — Discharge Instructions (Signed)
 Please transfer patient to the [facility] for treatment and stabilization of his mental health

## 2023-11-06 DIAGNOSIS — F1220 Cannabis dependence, uncomplicated: Secondary | ICD-10-CM | POA: Insufficient documentation

## 2023-11-06 DIAGNOSIS — F19950 Other psychoactive substance use, unspecified with psychoactive substance-induced psychotic disorder with delusions: Principal | ICD-10-CM | POA: Diagnosis present

## 2023-11-06 DIAGNOSIS — F332 Major depressive disorder, recurrent severe without psychotic features: Secondary | ICD-10-CM | POA: Insufficient documentation

## 2023-11-06 MED ORDER — TRAZODONE HCL 100 MG PO TABS
100.0000 mg | ORAL_TABLET | Freq: Every day | ORAL | Status: DC
  Administered 2023-11-06 – 2023-11-10 (×5): 100 mg via ORAL
  Filled 2023-11-06 (×5): qty 1

## 2023-11-06 MED ORDER — RISPERIDONE 1 MG PO TABS
1.0000 mg | ORAL_TABLET | Freq: Two times a day (BID) | ORAL | Status: DC
  Administered 2023-11-06 – 2023-11-07 (×2): 1 mg via ORAL
  Filled 2023-11-06 (×2): qty 1

## 2023-11-06 NOTE — BHH Suicide Risk Assessment (Signed)
 [facility] Admission Suicide Risk Assessment   Nursing information obtained from:  Patient Demographic factors:  Male, Caucasian, Living alone Current Mental Status:  NA Loss Factors:  NA Historical Factors:  NA Risk Reduction Factors:  NA  Total Time spent with patient: 1 hour Principal Problem: Substance-induced psychotic disorder with delusions (HCC) Diagnosis:  Principal Problem:   Substance-induced psychotic disorder with delusions (HCC) Active Problems:   Cannabis use disorder, severe, in controlled environment (HCC)   Subjective Data:   Gary Sanders is a 33 y.o. male  with a past psychiatric history of MDD. Patient initially arrived to [facility] on 9/29 under involuntary commitment via [facility] for erratic behavior, and admitted to [facility] under IVC on 9/30 for acute safety concerns, acute suicidal or self-harming behaviors, impaired functioning, and severe substance-induced psychosis or mood disturbances. PMHx is significant for hypertension.   Continued Clinical Symptoms:  Alcohol Use Disorder Identification Test Final Score (AUDIT): 0 The Alcohol Use Disorders Identification Test, Guidelines for Use in Primary Care, Second Edition.  World Science writer [facility]). Score between 0-7:  no or low risk or alcohol related problems. Score between 8-15:  moderate risk of alcohol related problems. Score between 16-19:  high risk of alcohol related problems. Score 20 or above:  warrants further diagnostic evaluation for alcohol dependence and treatment.   CLINICAL FACTORS:   Severe Anxiety and/or Agitation Alcohol/Substance Abuse/Dependencies Currently Psychotic Unstable or Poor Therapeutic Relationship Previous Psychiatric Diagnoses and Treatments   Psychiatric Specialty Exam:   Presentation    General Appearance:  Bizarre (Patient had a blanket wrapped around him and over his head)   Eye Contact:  Fleeting   Speech:  Normal Rate   Speech Volume:  Normal   Handedness:  --  (Not applicable)     Mood and Affect    Mood:  Anxious; Dysphoric (Patient is frightened)   Affect:  -- (Paranoid, anxious, withdrawn)     Thinking     Thought Processes:  Irrevelant; Disorganized   Descriptions of Associations:  Loose   Orientation:  Full (Time, Place and Person)   Thought Content:  Delusions; Illogical; Paranoid Ideation; Perseveration   History of Schizophrenia/Schizoaffective disorder:  No     Duration of Psychotic Symptoms: Since Saturday   Hallucinations:  Other (comment) (Per IVC, was responding to internal stimuli -- does not appear to be responding in person, however did say that he saw moving lights when watching television which hypnotized him last weekend) Per IVC paperwork, patient was telling someone who is not there to stop talking to him.   Ideas of Reference:  Delusions; Paranoia   Suicidal Thoughts:  No (Patient denies suicidal ideation, but did voice the command to jump off a balcony.  Hit your head against a window.  To a third-party when an acute psychosis) With Intent; With Plan   Homicidal Thoughts:  No     Sensorium      Memory:  Immediate Poor   Judgment:  Impaired   Insight:  Lacking     Executive Functions      Concentration:  Fair   Attention Span:  Fair   Recall:  Poor   Fund of Knowledge:  Fair   Language:  Good     Psychomotor Activity:  Decreased       Assets:  Housing       Sleep:  Good 8.75       Physical Exam Vitals reviewed.  Constitutional:      Appearance: He is normal  weight. He is not ill-appearing.  Pulmonary:     Effort: Pulmonary effort is normal. No respiratory distress.  Neurological:     Mental Status: He is alert.    COGNITIVE FEATURES THAT CONTRIBUTE TO RISK:  Closed-mindedness and Loss of executive function    SUICIDE RISK:  Moderate-Severe: Patient voiced suicidal statements when in the midst of the deepest part of his psychosis this past  Sunday as documented in the affidavit filed by his mother.  Patient denies SI at this time, and had good enough insight to voluntarily remove weapons from his home, which are now safely stored at his grandfather's place.  However his thinking remains very disorganized and is only beginning to come to grips with the severity of his symptomatology and the depth of what happened.  Suspect that over the course of the next few days patient will gain some insight, however at this time patient is emotionally labile and distraught.  I believe that he poses a significant risk to himself at this time.  PLAN OF CARE: See H&P for assessment and plan.   I certify that inpatient services furnished can reasonably be expected to improve the patient's condition.   Prynce Jacober, MD 11/06/2023, 4:48 PM

## 2023-11-06 NOTE — Plan of Care (Signed)
   Problem: Education: Goal: Emotional status will improve Outcome: Progressing Goal: Mental status will improve Outcome: Progressing

## 2023-11-06 NOTE — Group Note (Signed)
 Date:  11/06/2023 Time:  9:18 AM  Group Topic/Focus:  Goals Group:   The focus of this group is to help patients establish daily goals to achieve during treatment and discuss how the patient can incorporate goal setting into their daily lives to aide in recovery.    Participation Level:  Minimal  Participation Quality:  Appropriate  Affect:  Appropriate  Cognitive:  Appropriate  Insight: Appropriate  Engagement in Group:  Lacking and Limited  Modes of Intervention:  Discussion  Additional Comments:    Jernee Murtaugh R Caliph Bor[place]ak 11/06/2023, 9:18 AM

## 2023-11-06 NOTE — Plan of Care (Signed)
   Problem: Education: Goal: Knowledge of [place] [facility] Education information/materials will improve Outcome: Progressing Goal: Emotional status will improve Outcome: Progressing Goal: Mental status will improve Outcome: Progressing

## 2023-11-06 NOTE — Progress Notes (Signed)
 Tour of Duty:  Prentice JINNY Angle, RN, 11/06/23, Tour of Duty: 0700-1900  SI/HI/AVH: Denies  Self-Reported   Mood: Negative  Anxiety: Denies, but Observable Depression: Denies, but Observable Irritability: Denies, but Observable  Broset  Violence Prevention Guidelines *See Row Information*: (not recorded)   LBM  Last BM Date : (not recorded)   Pain: not present  Patient Refusals (including Rx): Yes, including scheduled Rx  >>Shift Summary: Patient observed to be calm but isolative to room. Patient able to make needs known. Patient observed to engage appropriately with staff and peers, but little initiation. Patient refusing medications as prescribed. Patient requesting Ativan. This shift, no PRN medication requested or required. No reported or observed side effects to medication. No reported or observed agitation, aggression, or other acute emotional distress. No reported or observed physical abnormalities or concerns.  Last Vitals  Vitals Weight: 76.1 kg Temp: 98.2 F (36.8 C) Temp Source: Oral Pulse Rate: 86 Resp: 20 BP: 136/85 Patient Position: (not recorded)  Admission Type  Psych Admission Type (Psych Patients Only) Admission Status: Involuntary Date 72 hour document signed : (not recorded) Time 72 hour document signed : (not recorded) Provider Notified (First and Last Name) (see details for LINK to note): (not recorded)   Psychosocial Assessment  Psychosocial Assessment Patient Complaints: None Eye Contact: Fair Facial Expression: Flat Affect: Preoccupied Speech: Loud Interaction: Guarded Motor Activity: Slow Appearance/Hygiene: Unremarkable Behavior Characteristics: Agitated Mood: Preoccupied   Aggressive Behavior  Targets: (not recorded)   Thought Process  Thought Process Coherency: Within Defined Limits Content: Preoccupation Delusions: None reported or observed Perception: Within Defined Limits Hallucination: None reported or  observed Judgment: Poor Confusion: None  Danger to Self/Others  Danger to Self Current suicidal ideation?: Denies Description of Suicide Plan: (not recorded) Self-Injurious Behavior: (not recorded) Agreement Not to Harm Self: (not recorded) Description of Agreement: (not recorded) Danger to Others: None reported or observed

## 2023-11-06 NOTE — [facility] Counselor (Signed)
 Adult Comprehensive Assessment  Patient ID: Gary Sanders, male   DOB: 10-Nov-1990, 33 y.o.   MRN: [id number]  Information Source: Information source: Patient  Current Stressors:  Patient states their primary concerns and needs for treatment are:: My mom IVC'd me, I was acting erratically, it felt like my mind and body were not connecting. They were thoughts I didn't have control of. It was terrifying Patient states their goals for this hospitilization and ongoing recovery are:: Counsellor / Learning stressors: I am taking classes Employment / Job issues: None reported Family Relationships: None reported Surveyor, quantity / Lack of resources (include bankruptcy): None reported Housing / Lack of housing: None reported Physical health (include injuries & life threatening diseases): None reported Social relationships: None reported Substance abuse: None reported (endorsed usage) Bereavement / Loss: None reported  Living/Environment/Situation:  Living Arrangements: Alone Living conditions (as described by patient or guardian): Apartment Who else lives in the home?: Alone How long has patient lived in current situation?: UTA What is atmosphere in current home: Comfortable  Family History:  Marital status: Single What is your sexual orientation?: UTA Has your sexual activity been affected by drugs, alcohol, medication, or emotional stress?: UTA Does patient have children?: No  Childhood History:  Additional childhood history information: Family hx of substance use and mental heath, reports father is an alcoholic Description of patient's relationship with caregiver when they were a child: UTA Patient's description of current relationship with people who raised him/her: UTA How were you disciplined when you got in trouble as a child/adolescent?: UTA Does patient have siblings?: Yes Number of Siblings: 1 Description of patient's current relationship with siblings: Sister Did patient  suffer any verbal/emotional/physical/sexual abuse as a child?: No Did patient suffer from severe childhood neglect?: No Has patient ever been sexually abused/assaulted/raped as an adolescent or adult?: No Was the patient ever a victim of a crime or a disaster?: No Witnessed domestic violence?: No Has patient been affected by domestic violence as an adult?: No  Education:  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?: No  Financial Resources:   Financial resources: Income from employment, Medicaid Does patient have a representative payee or guardian?: No  Alcohol/Substance Abuse:   What has been your use of drugs/alcohol within the last 12 months?: THC-A since 2023, past 1-2 months using dabs (E-rig), bubble gum runts and tropicana [employer] If attempted suicide, did drugs/alcohol play a role in this?: No If yes, describe treatment: UTA Has alcohol/substance abuse ever caused legal problems?: No  Social Support System:   Describe Community Support System: UTA Type of faith/religion: UTA How does patient's faith help to cope with current illness?: UTA  Leisure/Recreation:      Strengths/Needs:   What is the patient's perception of their strengths?: UTA Patient states they can use these personal strengths during their treatment to contribute to their recovery: UTA Patient states these barriers may affect/interfere with their treatment: UTA Patient states these barriers may affect  their return to the community: UTA  Discharge Plan:   Currently receiving community mental health services: No Patient states concerns and preferences for aftercare planning are: PCP [facility] Patient states they will know when they are safe and ready for discharge when: Would like to take Ativan Does patient have access to transportation?: Yes Does patient have financial barriers related to discharge medications?: No Will patient be returning to same living situation after discharge?: Yes  Summary/Recommendations:   Summary and Recommendations (to be completed by the evaluator): Gary Sanders is a 33yo male who is involuntarily admitted to [facility] secondary to [facility] due to bizarre behaviors and concern for SI. Pt appears disorganized and unable to focus answering some questions. 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 Does endorse THC-A usage, denies other substances currently. UDS+ marijuana. Denies AVH, denies SI and HI. Has a PCP through [facility] in [place] - Gary Gary Sanders. Does not have a therapist or psychiatrist currently. Declined consent for TOC to reach out to family. While here, Gary Sanders can benefit from crisis stabilization, medication management, therapeutic milieu, and referrals for services.   Gary Sanders. 11/06/2023

## 2023-11-06 NOTE — [facility] Group Notes (Signed)
 [facility] Group Notes:  (Nursing/MHT/Case Management/Adjunct)  Date:  11/06/2023  Time:  11:11 PM  Type of Therapy:  Psychoeducational Skills  Participation Level:  Minimal  Participation Quality:  Attentive  Affect:  Irritable  Cognitive:  Lacking  Insight:  Limited  Engagement in Group:  Developing/Improving  Modes of Intervention:  Education  Summary of Progress/Problems: Patient states that he had a good day overall and that he is trying to adjust to the hospital environment. His goal for tomorrow is to work on feeling better.   Welford Christmas S 11/06/2023, 11:11 PM

## 2023-11-06 NOTE — H&P (Addendum)
 Psychiatric Admission Assessment Adult  Patient Identification:  Gary Sanders MRN:  [id number] Date of Evaluation:  11/06/2023 Chief Complaint:  MDD Principal Diagnosis:  Substance-induced psychotic disorder with delusions (HCC) Diagnosis:  Principal Problem:   Substance-induced psychotic disorder with delusions (HCC) Active Problems:   Cannabis use disorder, severe, in controlled environment [facility])    CC:   My mom IVC'd me  Gary Sanders is a 33 y.o. male  with a past psychiatric history of MDD. Patient initially arrived to [facility] on 9/29 under involuntary commitment via [facility] for erratic behavior, and admitted to [facility] under IVC on 9/30 for acute safety concerns, acute suicidal or self-harming behaviors, impaired functioning, and severe substance-induced psychosis or mood disturbances. PMHx is significant for hypertension.   HPI:   On interview, patient was wearing a blanket over his head, suspicious appearing, holding himself in his arms.  On initial interview, patient said that my mom IVC'd me and shortly afterwards started bawling uncontrollably.  Repeatedly asked questions indicating a paranoid thought pattern including whether or not this information would be shared with other team members, framing information given as a hypothetical, and repeatedly asking if it was okay that he was talking like this.  Patient is notably very specific/touchy in certain of his definitions, how specific things are phrased, which is characteristic of somebody undergoing acute paranoia.  After much discussion, patient says that the incident that led to his presentation here was because he was watching television over the weekend which hypnotized him.  The result of this was the terrifying feeling of having his mind separate from his body, and being unable to control what his body was doing.  This is obviously causing him great emotional distress.  Patient is very suspicious of starting medication and  repeatedly voices the concern that he will be here forever.  Exhibits disorganized thinking during interview itself, and notably asks for Ativan repeatedly.  Endorses regular THC a use, but seems wary of the idea that his cannabis use may be contributing to his current presentation, noting also that it could just as well be caffeine.    After much (30 minutes plus) reassurance, patient is tentatively amenable to 7-day inpatient stay starting Risperdal 1 mg twice daily to handle his ongoing psychotic symptoms.  Patient denies suicidal and homicidal ideation both at this time and in the past.  Patient denies low mood and anhedonia.  Patient believes his anxiety is standard and appropriate.  Patient denies history of bipolar disorder.  Patient denies auditory visual hallucinations both now and in the past.  Is not currently seeing a psychiatrist.  Is also not currently seeing a therapist.  Denies history of nonsuicidal self-injury.  Patient did not give permission to reach out to collateral.  However, given the description of threat to self/others outlined in IVC form it became necessary to reach out to petitioner despite absence of patient permission. On interview with Lovett Arrant, mother and IVC petitioner [phone]):    On repeated call, patient was acting normal at grandparents place and then jammed himself in between the cabinet and the wall this past Sunday, screaming I'm burning, I'm burning' and pouring water all over himself. Saying I'm not OK, I'm not OK. Was speaking in third person, disorganized thought/speech. Appeared to have a mental breakdown. Was repeatedly passing out, profusely sweating. Was saying don't you turn your hand over. Do not turn your hand over. Said to self, jump off the balcony. Jump off the balcony. Bang your  head on the window and glass. Had very clearly smoked weed and smelled it on his breath. Had two vape pens in his possession.  Very flat. A  traumatizing, horrific experience. Had isolated, similar experiences but not like to this extent. In December, was in a manic state and was demanding to get some of his stuff from his Uncle's house, postured against ex-husband Do you want to fight? Patient *did* have access to firearms but his grandfather has taken these guns. Patient wanted them to be removed from the home two months ago. Lives by himself.    Psychiatric ROS:  As above  Past Psychiatric History: Current psychiatrist: Denies Current therapist: Denies Previous psychiatric diagnoses: Major depressive disorder, cannabis use Current psychiatric medications: Trazodone 100 mg Psychiatric medication history/compliance: Previously on sertraline Psychiatric hospitalization(s): Denies Psychotherapy history: Denies Neuromodulation history: Denies History of suicide (obtained from HPI): Per chart review, had suicidal thoughts 10 years ago to cut his wrists, denies presently History of homicide or aggression (obtained in HPI): Per collateral, patient had postured towards mother's ex-husband last December when undergoing a likely psychotic episode  Substance Abuse History: Alcohol: Unable to assess Tobacco: Unable to assess Cannabis: Patient endorses regular use of THC a every day.  Recently used a new kind of lax with electronic dab 1 to 2 months ago IV drug use: Denies Prescription drug use: Low-dose Cialis, illicitly received, however did ask repeatedly about Ativan during interview Other illicit drugs: Denies Rehab history: Denies  Past Medical History: PCP: Dr. 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 Yes.    Has the patient been a risk to self in the past 6 months? No.  Has the patient been a risk to self within the distant past? No.   Is the patient a risk to others? No.  Has the patient been a risk to others in the past 6 months? No.  Has the patient been a risk to others within the distant past? No.   [place] Scale:  Flowsheet Row [facility] (Current[facility] from 11[facility]04/2023 in [facility]  C-SSRS RISK CATEGORY Error: Q3, 4, or 5 should not be populated when Q2 is No High Risk     Tobacco Screening:  Social History   Tobacco Use  Smoking Status Every Day  Smokeless Tobacco Never    BH Tobacco Counseling     Are you interested in Tobacco Cessation Medications?  No, patient refused Counseled patient on smoking cessation:  Refused/Declined practical counseling Reason Tobacco Screening Not Completed: Patient Refused Screening       Social History:  Social History   Substance and Sexual Activity  Alcohol Use Yes   Comment: occ     Social History   Substance and Sexual  Activity  Drug Use No    Additional Social History: Marital status: Single What is your sexual orientation?: UTA Has your sexual activity been affected by drugs, alcohol, medication, or emotional stress?: UTA Does patient have children?: No    Allergies:   Allergies  Allergen Reactions   Amoxicillin Rash    Lab Results:  Results for orders placed or performed during the hospital encounter of 11/04/23 (from the past 48 hours)  CBC with Differential/Platelet     Status: Abnormal   Collection Time: 11/04/23 10:02 PM  Result Value Ref Range   WBC 8.2 4.0 - 10.5 K/uL   RBC 4.58 4.22 - 5.81 MIL/uL   Hemoglobin 14.3 13.0 - 17.0 g/dL   HCT 61.3 (L) 60.9 - 47.9 %   MCV 84.3 80.0 - 100.0 fL   MCH 31.2 26.0 - 34.0 pg   MCHC 37.0 (H) 30.0 - 36.0 g/dL   RDW 88.3 88.4 - 84.4 %   Platelets 210 150 - 400 K/uL   nRBC 0.0 0.0 - 0.2 %   Neutrophils Relative % 73 %   Neutro Abs 6.1 1.7 - 7.7 K/uL   Lymphocytes Relative 19 %   Lymphs Abs 1.6 0.7 - 4.0 K/uL   Monocytes Relative 6 %   Monocytes Absolute 0.5 0.1 - 1.0 K/uL   Eosinophils Relative 1 %   Eosinophils Absolute 0.1 0.0 - 0.5 K/uL   Basophils Relative 1 %   Basophils Absolute 0.1 0.0 - 0.1 K/uL   Immature Granulocytes 0 %   Abs Immature Granulocytes 0.03 0.00 - 0.07 K/uL    Comment: Performed at [facility], [address] [place], [place] [address]  Comprehensive metabolic panel     Status: Abnormal   Collection Time: 11/04/23 10:02 PM  Result Value Ref Range   Sodium 137 135 - 145 mmol/L   Potassium 4.3 3.5 - 5.1 mmol/L   Chloride 99 98 - 111 mmol/L   CO2 22 22 - 32 mmol/L   Glucose, Bld 83 70 - 99 mg/dL    Comment: Glucose reference range applies only to samples taken after fasting for at least 8 hours.   BUN 15 6 - 20 mg/dL   Creatinine, Ser 8.82 0.61 - 1.24 mg/dL   Calcium 9.6 8.9 - 89.6 mg/dL   Total Protein 6.9 6.5 - 8.1 g/dL   Albumin 4.5 3.5 - 5.0 g/dL   AST 16 15 - 41 U/L   ALT 14 0 - 44 U/L   Alkaline Phosphatase 37 (L) 38 - 126 U/L   Total Bilirubin 1.4 (H) 0.0 - 1.2 mg/dL   GFR, Estimated >39 >39 mL/min    Comment: (NOTE) Calculated using the CKD-EPI Creatinine Equation (2021)    Anion gap 16 (H) 5 - 15    Comment: Performed at [facility], [address] [place], [place] [address]  Hemoglobin A1c      Status: Abnormal   Collection Time: 11/04/23 10:02 PM  Result Value Ref Range   Hgb A1c MFr Bld 4.6 (L) 4.8 - 5.6 %    Comment: (NOTE) Diagnosis of Diabetes The following HbA1c ranges recommended by the American Diabetes Association (ADA) may be used as an aid in the diagnosis of diabetes mellitus.  Hemoglobin             Suggested A1C NGSP%              Diagnosis  <5.7  Non Diabetic  5.7-6.4                Pre-Diabetic  >6.4                   Diabetic  <7.0                   Glycemic control for                       adults with diabetes.     Mean Plasma Glucose 85.32 mg/dL    Comment: Performed at [facility], [address], [place], [place] [address]  Ethanol     Status: None   Collection Time: 11/04/23 10:02 PM  Result Value Ref Range   Alcohol, Ethyl (B) <15 <15 mg/dL    Comment: (NOTE) For medical purposes only. Performed at [facility], [address], [place], [place] [address]   Lipid panel     Status: None   Collection Time: 11/04/23 10:02 PM  Result Value Ref Range   Cholesterol 140 0 - 200 mg/dL   Triglycerides 26 <849 mg/dL   HDL 51 >59 mg/dL   Total CHOL/HDL Ratio 2.7 RATIO   VLDL 5 0 - 40 mg/dL   LDL Cholesterol 84 0 - 99 mg/dL    Comment:        Total Cholesterol/HDL:CHD Risk Coronary Heart Disease Risk Table                     Men   Women  1/2 Average Risk   3.4   3.3  Average Risk       5.0   4.4  2 X Average Risk   9.6   7.1  3 X Average Risk  23.4   11.0        Use the calculated Patient Ratio above and the CHD Risk Table to determine the patient's CHD Risk.        ATP III CLASSIFICATION (LDL):  <100     mg/dL   Optimal  899-870  mg/dL   Near or Above                    Optimal  130-159  mg/dL   Borderline  839-810  mg/dL   High  >809     mg/dL   Very High Performed at [facility], [address] [place], [place] [address]   TSH     Status: None   Collection Time: 11/04/23 10:02 PM   Result Value Ref Range   TSH 1.644 0.350 - 4.500 uIU/mL    Comment: Performed by a 3rd Generation assay with a functional sensitivity of <=0.01 uIU/mL. Performed at [facility], [address] [place], [place] [address]   POCT Urine Drug Screen - (I-Screen)     Status: Abnormal   Collection Time: 11/04/23 10:06 PM  Result Value Ref Range   POC Amphetamine UR None Detected NONE DETECTED (Cut Off Level 1000 ng/mL)   POC Secobarbital (BAR) None Detected NONE DETECTED (Cut Off Level 300 ng/mL)   POC Buprenorphine (BUP) None Detected NONE DETECTED (Cut Off Level 10 ng/mL)   POC Oxazepam (BZO) None Detected NONE DETECTED (Cut Off Level 300 ng/mL)   POC Cocaine UR None Detected NONE DETECTED (Cut Off Level 300 ng/mL)   POC Methamphetamine UR None Detected NONE DETECTED (Cut Off Level 1000 ng/mL)  POC Morphine None Detected NONE DETECTED (Cut Off Level 300 ng/mL)   POC Methadone UR None Detected NONE DETECTED (Cut Off Level 300 ng/mL)   POC Oxycodone UR None Detected NONE DETECTED (Cut Off Level 100 ng/mL)   POC Marijuana UR Positive (A) NONE DETECTED (Cut Off Level 50 ng/mL)    Blood alcohol level:  Lab Results  Component Value Date   [facility] <15 11/04/2023    Metabolic disorder labs:  Lab Results  Component Value Date   HGBA1C 4.6 (L) 11/04/2023   MPG 85.32 11/04/2023   No results found for: PROLACTIN Lab Results  Component Value Date   CHOL 140 11/04/2023   TRIG 26 11/04/2023   HDL 51 11/04/2023   CHOLHDL 2.7 11/04/2023   VLDL 5 11/04/2023   LDLCALC 84 11/04/2023    Current Medications: Current Facility-Administered Medications  Medication Dose Route Frequency Provider Last Rate Last Admin   acetaminophen (TYLENOL) tablet 650 mg  650 mg Oral Q6H PRN Tex Drilling, NP   650 mg at 11/06/23 1508   alum & mag hydroxide-simeth (MAALOX/MYLANTA) 200-200-20 MG/5ML suspension 30 mL  30 mL Oral Q4H PRN Nkwenti, Doris, NP       haloperidol (HALDOL) tablet 5 mg  5 mg Oral TID  PRN Tex Drilling, NP       And   diphenhydrAMINE (BENADRYL) capsule 50 mg  50 mg Oral TID PRN Tex Drilling, NP       haloperidol lactate (HALDOL) injection 10 mg  10 mg Intramuscular TID PRN Tex Drilling, NP       And   diphenhydrAMINE (BENADRYL) injection 50 mg  50 mg Intramuscular TID PRN Tex Drilling, NP       And   LORazepam (ATIVAN) injection 2 mg  2 mg Intramuscular TID PRN Tex Drilling, NP       haloperidol lactate (HALDOL) injection 5 mg  5 mg Intramuscular TID PRN Tex Drilling, NP       And   diphenhydrAMINE (BENADRYL) injection 50 mg  50 mg Intramuscular TID PRN Tex Drilling, NP       And   LORazepam (ATIVAN) injection 2 mg  2 mg Intramuscular TID PRN Tex Drilling, NP       hydrOXYzine (ATARAX) tablet 25 mg  25 mg Oral TID PRN Tex Drilling, NP       magnesium hydroxide (MILK OF MAGNESIA) suspension 30 mL  30 mL Oral Daily PRN Tex Drilling, NP       risperiDONE (RISPERDAL) tablet 1 mg  1 mg Oral BID Rollene Katz, MD       traZODone (DESYREL) tablet 100 mg  100 mg Oral QHS Rollene Katz, MD        PTA Medications: Medications Prior to Admission  Medication Sig Dispense Refill Last Dose/Taking   meloxicam (MOBIC) 15 MG tablet Take 15 mg by mouth daily as needed (For pain or swelling).      traZODone (DESYREL) 100 MG tablet Take 100 mg by mouth at bedtime.       Psychiatric Specialty Exam:  Presentation   General Appearance:  Bizarre (Patient had a blanket wrapped around him and over his head)  Eye Contact:  Fleeting  Speech:  Normal Rate  Speech Volume:  Normal  Handedness:  -- (Not applicable)   Mood and Affect   Mood:  Anxious; Dysphoric (Patient is frightened)  Affect:  -- (Paranoid, anxious, withdrawn)   Thinking   Thought Processes:  Irrevelant; Disorganized  Descriptions of Associations:  Loose  Orientation:  Full (Time, Place and Person)  Thought Content:  Delusions; Illogical; Paranoid Ideation;  Perseveration  History of Schizophrenia/Schizoaffective disorder:  No   Duration of Psychotic Symptoms: Since Saturday  Hallucinations:  Other (comment) (Per IVC, was responding to internal stimuli -- does not appear to be responding in person, however did say that he saw moving lights when watching television which hypnotized him last weekend) Per IVC paperwork, patient was telling someone who is not there to stop talking to him.  Ideas of Reference:  Delusions; Paranoia  Suicidal Thoughts:  No (Patient denies suicidal ideation, but did voice the command to jump off a balcony.  Hit your head against a window.  To a third-party when an acute psychosis) With Intent; With Plan  Homicidal Thoughts:  No   Sensorium    Memory:  Immediate Poor  Judgment:  Impaired  Insight:  Lacking   Executive Functions    Concentration:  Fair  Attention Span:  Fair  Recall:  Poor  Fund of Knowledge:  Fair  Language:  Good   Psychomotor Activity:  Decreased    Assets:  Housing    Sleep:  Good 8.75    Physical Exam Vitals reviewed.  Constitutional:      Appearance: He is normal weight. He is not ill-appearing.  Pulmonary:     Effort: Pulmonary effort is normal. No respiratory distress.  Neurological:     Mental Status: He is alert.    Review of Systems  Psychiatric/Behavioral:  Negative for depression and suicidal ideas. The patient is nervous/anxious.   All other systems reviewed and are negative.  Blood pressure 113/79, pulse 97, temperature 98.2 F (36.8 C), temperature source Oral, resp. rate 20, height 6' 1 (1.854 m), weight 76.1 kg, SpO2 98%. Body mass index is 22.14 kg/m.   Treatment Plan Summary: Daily contact with patient to assess and evaluate symptoms and progress in treatment and Medication management   ASSESSMENT:    Patient is a 33 year old male with a past psychiatric history of major depressive disorder and regular  cannabis use who presents with a relatively clear case of substance-induced psychotic disorder secondary to [facility] use. Patient reports getting a new electronic dab 1 to 2 months ago. Per collateral, it appears that patient smelled like THC when he underwent the acute psychotic episode described in the initial affidavit last Saturday (details above). Suspects the cannabis may have been adulterated this time. Patient has had similar but less intense episodes acutely over the past couple of years.  Has also shown some hostile behaviors during these episodes which appear to resolve quickly, further building the case for a substance-induced psychosis.   Patient remains disorganized in terms of thought process and content, although he does have some insight into his behavior prior to admission, calling it erratic. He appears to still seriously believe that these uncharacteristic behaviors were triggered because he was hypnotized in the process of watching Alien: Earth (a new [employer] series) as well as Joker: Folie a Deux (this [place] sequel to the 2019 best picture, co-starring Marny Pee.) it was during this process that he lost connection between his mind and his body. This coincides with collateral information, saying that he was talking to himself (his body) in the third person.  During the initial interview, patient clearly continued to be paranoid (can I tell you, really?,  Am I going to be here forever?) and required repeated assurances that this was a circle of trust  and that we do everything we can to help over the next 7 or so days. After extensive counseling, patient agreed to start Risperdal 1 mg twice daily alongside 100 mg of trazodone to be given exactly at 9:45 PM.  Will continue involuntary commitment at this time due to patient's ongoing paranoia and disorganized thinking/behavior.  Diagnoses / Active Problems: Substance-induced psychotic disorder Cannabis  use disorder, severe, in controlled evnironment History of major depressive disorder  PLAN:  Safety and Monitoring: - INVOLUNTARY  admission to inpatient psychiatric unit for safety, stabilization and treatment. - Daily contact with patient to assess and evaluate symptoms and progress in treatment - Patient's case to be discussed in multi-disciplinary team meeting -  Observation Level : q15 minute checks -  Vital signs:  q12 hours -  Precautions: suicide, elopement, and assault  2. Psychiatric Diagnoses and Treatment:    # Substance-induced psychotic disorder (cannabis, possibly adulterated) # Cannabis use disorder, severe, in controlled environment - Risperdal 1 mg twice daily for paranoia and disorganized thinking. - Will need counseling regarding cannabis use and its relation to psychosis when he clears. Patient is high-functioning at baseline and may benefit from simply being given relevant survey literature (https://bean-daniels.com/) for example.   - The risks/benefits/side-effects/alternatives to this medication were discussed in detail with the patient and time was given for questions. The patient consents to medication trial.  - Metabolic profile and EKG monitoring obtained while on an atypical antipsychotic  BMI: 22.1 TSH: 1.6 Lipid panel: LDL 84 HbgA1c: 4.6% QTc: 450 - Encouraged patient to participate in unit milieu and in scheduled group therapies  - Short Term Goals: Ability to identify changes in lifestyle to reduce recurrence of condition will improve, Ability to verbalize feelings will improve, and Ability to demonstrate self-control will improve - Long Term Goals: Improvement in symptoms so as ready for discharge  Other PRNS: Minor pain, sleep, agitation, anxiety  Other labs reviewed on admission:    3. Medical Issues Being Addressed:   #HTN history - 144/97 --> 136/85 - Continue to monitor  4. Discharge Planning:   - Estimated  discharge date: 7 to 10 days - Social work and case management to assist with discharge planning and identification of hospital follow-up needs prior to discharge. - Discharge concerns: Need to establish a safety plan; medication compliance and effectiveness. - Discharge goals: Return home with outpatient referrals for mental health follow-up including medication management/psychotherapy.  I certify that inpatient services furnished can reasonably be expected to improve the patient's condition.    NB: This note was created using a voice recognition software as a result there may be grammatical errors inadvertently enclosed that do not reflect the nature of this encounter. Every attempt is made to correct such errors.   Odis Cleveland, MD PGY-2, Psychiatry Residency  9/30/20254:49 PM

## 2023-11-06 NOTE — BH Assessment (Signed)
(  Sleep Hours) - 8.75 (Any PRNs that were needed, meds refused, or side effects to meds)-  (Any disturbances and when (visitation, over night)- None (Concerns raised by the patient)- None (SI/HI/AVH)- Denies

## 2023-11-06 NOTE — Plan of Care (Signed)
   Problem: Education: Goal: Knowledge of [facility] Education information/materials will improve Outcome: Progressing Goal: Emotional status will improve Outcome: Progressing Goal: Mental status will improve Outcome: Progressing Goal: Verbalization of understanding the information provided will improve Outcome: Progressing

## 2023-11-06 NOTE — Group Note (Signed)
 LCSW Group Therapy Note   Group Date: 11/06/2023 Start Time: 1100 End Time: 1200   Participation:  did not attend  Type of Therapy:  Group Therapy  Topic:  Healing of the Hearts:  A Safe Space for Grief   Objective:  The objective of this class, Healing Hearts: A Safe Space for Grief, is to create a compassionate environment where participants can process their grief, explore different stages of grief, and discover ways to honor their loved ones through personal rituals.  Goals: Provide a safe and supportive space where participants feel comfortable sharing their feelings and experiences of grief without judgment. Educate participants about the stages of grief and emphasize that there is no right way to grieve or a fixed timeline for healing. Introduce the concept of rituals as a means to process grief, allowing individuals to honor their loved ones in a personal and meaningful way.  Summary:  In Healing Hearts: A Safe Space for Grief, we explored the unique and personal journey of grief, emphasizing that everyone experiences it differently. We discussed the five stages of grief (denial, anger, bargaining, depression, and acceptance), with the understanding that grief is not linear. Rituals were introduced as a way to help cope with loss, offering comfort and connection through meaningful actions such as lighting candles or taking memory walks. Participants were encouraged to express their emotions, focus on self-care, and reflect on moments of gratitude for their loved ones, recognizing that healing is a process and there is no timeline for grief.  Therapeutic Modalities: - Elements of CBT:  Cognitive Restructuring - Elements of DBT:  Distress Tolerance, Emotion Regulation   Cianni Manny O Sundeep Cary, LCSWA 11/06/2023  12:34 PM

## 2023-11-07 ENCOUNTER — Encounter (HOSPITAL_COMMUNITY): Payer: Self-pay

## 2023-11-07 MED ORDER — RISPERIDONE 2 MG PO TABS
2.0000 mg | ORAL_TABLET | Freq: Every day | ORAL | Status: DC
Start: 2023-11-08 — End: 2023-11-11
  Filled 2023-11-07 (×3): qty 1

## 2023-11-07 NOTE — BH IP Treatment Plan (Signed)
 Interdisciplinary Treatment and Diagnostic Plan Update  11/07/2023 Time of Session: 1010AM Gary Sanders MRN: [id number]  Principal Diagnosis: Substance-induced psychotic disorder with delusions (HCC)  Secondary Diagnoses: Principal Problem:   Substance-induced psychotic disorder with delusions (HCC) Active Problems:   Cannabis use disorder, severe, in controlled environment (HCC)   Current Medications:  Current Facility-Administered Medications  Medication Dose Route Frequency Provider Last Rate Last Admin   acetaminophen (TYLENOL) tablet 650 mg  650 mg Oral Q6H PRN Tex Drilling, NP   650 mg at 11/07/23 0739   alum & mag hydroxide-simeth (MAALOX/MYLANTA) 200-200-20 MG/5ML suspension 30 mL  30 mL Oral Q4H PRN Tex Drilling, NP       haloperidol (HALDOL) tablet 5 mg  5 mg Oral TID PRN Tex Drilling, NP       And   diphenhydrAMINE (BENADRYL) capsule 50 mg  50 mg Oral TID PRN Tex Drilling, NP       haloperidol lactate (HALDOL) injection 10 mg  10 mg Intramuscular TID PRN Tex Drilling, NP       And   diphenhydrAMINE (BENADRYL) injection 50 mg  50 mg Intramuscular TID PRN Tex Drilling, NP       And   LORazepam (ATIVAN) injection 2 mg  2 mg Intramuscular TID PRN Tex Drilling, NP       haloperidol lactate (HALDOL) injection 5 mg  5 mg Intramuscular TID PRN Tex Drilling, NP       And   diphenhydrAMINE (BENADRYL) injection 50 mg  50 mg Intramuscular TID PRN Tex Drilling, NP       And   LORazepam (ATIVAN) injection 2 mg  2 mg Intramuscular TID PRN Tex Drilling, NP       hydrOXYzine (ATARAX) tablet 25 mg  25 mg Oral TID PRN Tex Drilling, NP       magnesium hydroxide (MILK OF MAGNESIA) suspension 30 mL  30 mL Oral Daily PRN Tex Drilling, NP       risperiDONE (RISPERDAL) tablet 1 mg  1 mg Oral BID Rollene Katz, MD   1 mg at 11/07/23 0739   traZODone (DESYREL) tablet 100 mg  100 mg Oral QHS Crawford, Benjamin, MD   100 mg at 11/06/23 2101   PTA  Medications: Medications Prior to Admission  Medication Sig Dispense Refill Last Dose/Taking   meloxicam (MOBIC) 15 MG tablet Take 15 mg by mouth daily as needed (For pain or swelling).      traZODone (DESYREL) 100 MG tablet Take 100 mg by mouth at bedtime.       Patient Stressors: Educational concerns    Patient Strengths: General fund of knowledge   Treatment Modalities: Medication Management, Group therapy, Case management,  1 to 1 session with clinician, Psychoeducation, Recreational therapy.   Physician Treatment Plan for Primary Diagnosis: Substance-induced psychotic disorder with delusions (HCC) Long Term Goal(s):     Short Term Goals: Ability to identify changes in lifestyle to reduce recurrence of condition will improve Ability to verbalize feelings will improve Ability to demonstrate self-control will improve  Medication Management: Evaluate patient's response, side effects, and tolerance of medication regimen.  Therapeutic Interventions: 1 to 1 sessions, Unit Group sessions and Medication administration.  Evaluation of Outcomes: Not Progressing  Physician Treatment Plan for Secondary Diagnosis: Principal Problem:   Substance-induced psychotic disorder with delusions (HCC) Active Problems:   Cannabis use disorder, severe, in controlled environment (HCC)  Long Term Goal(s):     Short Term Goals: Ability to identify changes in lifestyle to reduce  recurrence of condition will improve Ability to verbalize feelings will improve Ability to demonstrate self-control will improve     Medication Management: Evaluate patient's response, side effects, and tolerance of medication regimen.  Therapeutic Interventions: 1 to 1 sessions, Unit Group sessions and Medication administration.  Evaluation of Outcomes: Not Progressing   RN Treatment Plan for Primary Diagnosis: Substance-induced psychotic disorder with delusions (HCC) Long Term Goal(s): Knowledge of disease and  therapeutic regimen to maintain health will improve  Short Term Goals: Ability to remain free from injury will improve, Ability to verbalize frustration and anger appropriately will improve, Ability to demonstrate self-control, Ability to participate in decision making will improve, Ability to verbalize feelings will improve, Ability to disclose and discuss suicidal ideas, Ability to identify and develop effective coping behaviors will improve, and Compliance with prescribed medications will improve  Medication Management: RN will administer medications as ordered by provider, will assess and evaluate patient's response and provide education to patient for prescribed medication. RN will report any adverse and/or side effects to prescribing provider.  Therapeutic Interventions: 1 on 1 counseling sessions, Psychoeducation, Medication administration, Evaluate responses to treatment, Monitor vital signs and CBGs as ordered, Perform/monitor CIWA, COWS, AIMS and Fall Risk screenings as ordered, Perform wound care treatments as ordered.  Evaluation of Outcomes: Not Progressing   LCSW Treatment Plan for Primary Diagnosis: Substance-induced psychotic disorder with delusions (HCC) Long Term Goal(s): Safe transition to appropriate next level of care at discharge, Engage patient in therapeutic group addressing interpersonal concerns.  Short Term Goals: Engage patient in aftercare planning with referrals and resources, Increase social support, Increase ability to appropriately verbalize feelings, Increase emotional regulation, Facilitate acceptance of mental health diagnosis and concerns, Facilitate patient progression through stages of change regarding substance use diagnoses and concerns, Identify triggers associated with mental health/substance abuse issues, and Increase skills for wellness and recovery  Therapeutic Interventions: Assess for all discharge needs, 1 to 1 time with Social worker, Explore  available resources and support systems, Assess for adequacy in community support network, Educate family and significant other(s) on suicide prevention, Complete Psychosocial Assessment, Interpersonal group therapy.  Evaluation of Outcomes: Not Progressing   Progress in Treatment: Attending groups: Yes. Participating in groups: No. Taking medication as prescribed: Yes. Toleration medication: Yes. Family/Significant other contact made: No, will contact:  Brena Bolder, [place], [phone] (cell) Patient understands diagnosis: No. Discussing patient identified problems/goals with staff: Yes. Medical problems stabilized or resolved: Yes. Denies suicidal/homicidal ideation: Yes. Issues/concerns per patient self-inventory: No.  New problem(s) identified: No, Describe:  none  New Short Term/Long Term Goal(s): medication management for mood stabilization; elimination of SI thoughts; development of comprehensive mental wellness/sobriety plan   Patient Goals:  Discharge  Discharge Plan or Barriers: Patient recently admitted. CSW will continue to follow and assess for appropriate referrals and possible discharge planning.    Reason for Continuation of Hospitalization: Delusions  Medication stabilization  Estimated Length of Stay: 5-7 days  Last 3 [place] Suicide Severity Risk Score: Flowsheet Row Ad[facility] (Current) [facility] from 11/04/2023 in [facility]  C-SSRS RISK CATEGORY Error: Q3, 4, or 5 should not be populated when Q2 is No High Risk    Last PHQ 2/9 Scores:     No data to display          Scribe for Treatment Team: [name], LCSWA 11/07/2023 11:27 AM

## 2023-11-07 NOTE — Group Note (Signed)
 Date:  11/07/2023 Time:  3:43 PM  Group Topic/Focus: Sleep Hygiene Dimensions of Wellness:   The focus of this group is to introduce the topic of wellness and discuss the role each dimension of wellness plays in total health.    Participation Level:  Did Not Attend   Gary Sanders 11/07/2023, 3:43 PM
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
## 2023-11-07 NOTE — Plan of Care (Signed)
  Problem: Education: Goal: Emotional status will improve Outcome: Progressing Goal: Verbalization of understanding the information provided will improve Outcome: Progressing   Problem: Activity: Goal: Sleeping patterns will improve Outcome: Progressing   Problem: Health Behavior/Discharge Planning: Goal: Identification of resources available to assist in meeting health care needs will improve Outcome: Progressing   Problem: Physical Regulation: Goal: Ability to maintain clinical measurements within normal limits will improve Outcome: Progressing

## 2023-11-07 NOTE — Group Note (Signed)
 Recreation Therapy Group Note   Group Topic:Other  Group Date: 11/07/2023 Start Time: 0930 End Time: 1000 Facilitators: Brahm Barbeau-McCall, LRT,CTRS Location: [address] Dayroom   Group Topic: Communication, Problem Solving   Goal Area(s) Addresses:  Patient will effectively listen to complete activity.  Patient will identify communication skills used to make activity successful.  Patient will identify how skills used during activity can be used to reach post d/c goals.    Intervention: Building surveyor Activity - Geometric pattern cards, pencils, blank paper    Activity: Geometric Drawings.  Three volunteers from the peer group will be shown an abstract picture with a particular arrangement of geometrical shapes.  Each round, one 'speaker' will describe the pattern, as accurately as possible without revealing the image to the group.  The remaining group members will listen and draw the picture to reflect how it is described to them. Patients with the role of 'listener' cannot ask clarifying questions but, may request that the speaker repeat a direction. Once the drawings are complete, the presenter will show the rest of the group the picture and compare how close each person came to drawing the picture. LRT will facilitate a post-activity discussion regarding effective communication and the importance of planning, listening, and asking for clarification in daily interactions with others.  Education: Environmental consultant, Active listening, Support systems, Discharge planning  Education Outcome: Acknowledges understanding/In group clarification offered/Needs additional education.    Affect/Mood: N/A   Participation Level: N/A    Clinical Observations/Individualized Feedback: Recreation therapy group session did not occur due to previous going time.     Plan: Continue to engage patient in RT group sessions 2-3x/week.   Drayk Humbarger-McCall, LRT,CTRS 11/07/2023 2:05 PM

## 2023-11-07 NOTE — Progress Notes (Signed)
(  Sleep Hours) -8.0 (Any PRNs that were needed, meds refused, or side effects to meds)- none (Any disturbances and when (visitation, over night)-none (Concerns raised by the patient)- none (SI/HI/AVH)- denies all

## 2023-11-07 NOTE — Progress Notes (Signed)
   11/07/23 0800  Psych Admission Type (Psych Patients Only)  Admission Status Involuntary  Psychosocial Assessment  Patient Complaints None  Eye Contact Fair  Facial Expression Flat  Affect Preoccupied  Speech Logical/coherent  Interaction Guarded  Motor Activity Slow  Appearance/Hygiene Unremarkable  Behavior Characteristics Guarded;Irritable  Mood Preoccupied  Thought Process  Coherency WDL  Content Preoccupation  Delusions None reported or observed  Perception WDL  Hallucination None reported or observed  Judgment Poor  Confusion None  Danger to Self  Current suicidal ideation? Denies  Agreement Not to Harm Self Yes  Description of Agreement verbal  Danger to Others  Danger to Others None reported or observed

## 2023-11-07 NOTE — Group Note (Signed)
 Date:  11/07/2023 Time:  11:44 AM  Group Topic/Focus:  Goals Group:   The focus of this group is to help patients establish daily goals to achieve during treatment and discuss how the patient can incorporate goal setting into their daily lives to aide in recovery.    Participation Level:  Active  Participation Quality:  Appropriate  Affect:  Appropriate  Cognitive:  Appropriate  Insight: Appropriate  Engagement in Group:  Engaged  Modes of Intervention:  Education  Additional Comments:    Gary Sanders 11/07/2023, 11:44 AM

## 2023-11-07 NOTE — Progress Notes (Signed)
 Spirituality group facilitated by Elia Rockie Sofia, BCC.  Group Description: Group focused on topic of hope. Patients participated in facilitated discussion around topic, connecting with one another around experiences and definitions for hope. Group members engaged with visual explorer photos, reflecting on what hope looks like for them today. Group engaged in discussion around how their definitions of hope are present today in hospital.  Modalities: Psycho-social ed, Adlerian, Narrative, MI  Patient Progress: Gary Sanders attended group and actively engaged and participated in group conversation and activities.

## 2023-11-08 NOTE — Group Note (Signed)
 LCSW Group Therapy Note   Group Date: 11/08/2023 Start Time: 1100 End Time: 1200   Participation:  patient was present and actively participated in the discussion.    Type of Therapy:  Group Therapy  Topic:  Speaking from the Heart:  Communicating with Understanding and Empathy  Objective:  To help participants develop effective communication skills to express themselves clearly, listen actively, and navigate conflicts in a healthy way.  Goals: Increase awareness of verbal and non-verbal communication skills. Practice using "I" statements and active listening techniques. Learn coping strategies for managing communication stress.  Summary:  Participants explored the importance of communication, discussed challenges, and practiced skills such as active listening and assertive expression. They reflected on past experiences and identified ways to improve communication in their daily lives.  Therapeutic Modalities: Cognitive-Behavioral Therapy (CBT): Restructuring negative thought patterns in communication. Mindfulness: Staying present and calm during conversations. Psychoeducation: Learning about effective communication techniques.   Arihanna Estabrook O Aliegha Paullin, LCSWA 11/08/2023  12:57 PM

## 2023-11-08 NOTE — Group Note (Signed)
 Date:  11/08/2023 Time:  4:37 PM  Group Topic/Focus:  Overcoming Stress:   The focus of this group is to define stress and help patients assess their triggers.    Participation Level:  Active  Participation Quality:  Appropriate  Affect:  Appropriate  Cognitive:  Appropriate  Insight: Appropriate  Engagement in Group:  Engaged  Modes of Intervention:  Education   Kendy Haston 11/08/2023, 4:37 PM

## 2023-11-08 NOTE — [facility] Suicide Risk Assessment (Signed)
 [facility] INPATIENT:  Family/Significant Other Suicide Prevention Education  Suicide Prevention Education:  Education Completed; Gary Sanders, [phone] (cell), (name of family member/significant other) has been identified by the patient as the family member/significant other with whom the patient will be residing, and identified as the person(s) who will aid the patient in the event of a mental health crisis (suicidal ideations/suicide attempt).  With written consent from the patient, the family member/significant other has been provided the following suicide prevention education, prior to the and/or following the discharge of the patient.  Pt had a psychotic break at grandfathers house on Sunday. About 1.5 months ago, pt asked grandfather to take his guns and ammunition because he didn't want them in his house. Firearms have been there ever since. Grandfather will pick pt up at discharge, aware by 11AM on that day. If unavailable, mother or step-mother will pick pt up.    Grandfather talked with patient this morning, pt did not say anything odd or bizarre, just that he was ready to discharge. Does not have safety concerns with patient returning to their home in the future or at discharge to get his vehicle.   The suicide prevention education provided includes the following: Suicide risk factors Suicide prevention and interventions National Suicide Hotline telephone number [facility] assessment telephone number [facility] Emergency Assistance 911 [facility] and/or Residential Mobile Crisis Unit telephone number  Request made of family/significant other to: Remove weapons (e.g., guns, rifles, knives), all items previously/currently identified as safety concern.   Remove drugs/medications (over-the-counter, prescriptions, illicit drugs), all items previously/currently identified as a safety concern.  The family member/significant other verbalizes understanding of  the suicide prevention education information provided.  The family member/significant other agrees to remove the items of safety concern listed above.  [name] 11/08/2023, 11:23 AM
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
## 2023-11-08 NOTE — Group Note (Signed)
 Date:  11/08/2023 Time:  5:31 AM  Group Topic/Focus:  Wrap-Up Group:   The focus of this group is to help patients review their daily goal of treatment and discuss progress on daily workbooks.    Participation Level:  Active  Participation Quality:  Appropriate  Affect:  Appropriate  Cognitive:  Appropriate  Insight: Appropriate  Engagement in Group:  Engaged  Modes of Intervention:  Discussion  Additional Comments:   Pt states he wants to work on his tendency to over think  Gary Sanders [name] 11/08/2023, 5:31 AM

## 2023-11-08 NOTE — Group Note (Signed)
 Date:  11/08/2023 Time:  9:52 AM  Group Topic/Focus:  Goals Group:   The focus of this group is to help patients establish daily goals to achieve during treatment and discuss how the patient can incorporate goal setting into their daily lives to aide in recovery.    Participation Level:  Minimal  Participation Quality:  Inattentive  Affect:  Blunted  Cognitive:  Appropriate  Insight: Lacking  Engagement in Group:  Lacking  Modes of Intervention:  Discussion and Education  Additional Comments:    Felisa Zechman R Kasiya Burck 11/08/2023, 9:52 AM

## 2023-11-08 NOTE — Plan of Care (Signed)
   Problem: Education: Goal: Knowledge of [place] [facility] Education information/materials will improve Outcome: Progressing Goal: Emotional status will improve Outcome: Progressing Goal: Mental status will improve Outcome: Progressing

## 2023-11-08 NOTE — Progress Notes (Signed)
(  Sleep Hours) -6.5 (Any PRNs that were needed, meds refused, or side effects to meds)-prn tylenol for headache @ 2117  (Any disturbances and when (visitation, over night)-none (Concerns raised by the patient)- none (SI/HI/AVH)- denies all

## 2023-11-08 NOTE — Plan of Care (Signed)
   Problem: Education: Goal: Emotional status will improve Outcome: Progressing Goal: Mental status will improve Outcome: Progressing

## 2023-11-08 NOTE — Group Note (Unsigned)
 Date:  11/09/2023 Time:  1:59 AM  Group Topic/Focus:  Wrap-Up Group:   The focus of this group is to help patients review their daily goal of treatment and discuss progress on daily workbooks.    Participation Level:  Active  Participation Quality:  Appropriate and Sharing  Affect:  Appropriate  Cognitive:  Appropriate  Insight: Appropriate  Engagement in Group:  Engaged  Modes of Intervention:  Activity and Socialization  Additional Comments:   Patients shared one high-point and one low-point from their day. Patient high point, talking with family. Patient low-point, not knowing when discharge is going to happen. Patient rated his day a 10 out of 10.   [name] 11/09/2023, 1:59 AM

## 2023-11-08 NOTE — Group Note (Signed)
 Date:  11/08/2023 Time:  2:58 PM  Group Topic/Focus:  Emotional Education: The session centered around the Circle of Control activity, which helps participants identify what aspects of their life they can control versus what is outside their control. This exercise encourages emotional awareness, personal empowerment, and stress management. Participants were guided to reflect on situations in their life and categorize them into three areas: Things I can control, Things I can influence, and Things I cannot control. The group was encouraged to reflect on the impact these distinctions have on their emotional well-being.  Participation Level:  Active  Participation Quality:  Attentive  Affect:  Appropriate  Cognitive:  Appropriate  Insight: Appropriate  Engagement in Group:  Engaged  Modes of Intervention:  Activity and Discussion  Additional Comments:    Gary Sanders R Gary Sanders 11/08/2023, 2:58 PM

## 2023-11-08 NOTE — Progress Notes (Signed)
 Tour of Duty:  Prentice JINNY Angle, RN, 11/08/23, Tour of Duty: 0700-1900  SI/HI/AVH: Denies  Self-Reported   Mood: Neutral  Anxiety: Denies Depression: Denies Irritability: Denies, but Observable  Broset  Violence Prevention Guidelines *See Row Information*: Moderate Violence Risk interventions implemented   LBM  Last BM Date : 11/08/23   Pain: not present  Patient Refusals (including Rx): Yes, including scheduled Rx  >>Shift Summary: Patient observed to be withdrawn on unit. Patient suspicious and apprehensive when interacting with staff. Patient able to make needs known. Patient observed to engage appropriately with peers, but minimal initiation. Patient refusing medications as prescribed. This shift, no PRN medication requested or required. No reported or observed side effects to medication. No reported or observed agitation, aggression, or other acute emotional distress. No reported or observed physical abnormalities or concerns.  Last Vitals  Vitals Weight: 76.1 kg Temp: (!) 97.5 F (36.4 C) Temp Source: Oral Pulse Rate: 81 Resp: 16 BP: (!) 142/94 Patient Position: (not recorded)  Admission Type  Psych Admission Type (Psych Patients Only) Admission Status: Involuntary Date 72 hour document signed : (not recorded) Time 72 hour document signed : (not recorded) Provider Notified (First and Last Name) (see details for LINK to note): (not recorded)   Psychosocial Assessment  Psychosocial Assessment Patient Complaints: None Eye Contact: Fair Facial Expression: Flat Affect: Apprehensive Speech: Logical/coherent Interaction: Guarded, Minimal Motor Activity: Other (Comment) (WDL) Appearance/Hygiene: Unremarkable Behavior Characteristics: Resistant to care Mood: Apprehensive   Aggressive Behavior  Targets: (not recorded)   Thought Process  Thought Process Coherency: Within Defined Limits Content: Within Defined Limits Delusions: None reported or  observed Perception: Within Defined Limits Hallucination: None reported or observed Judgment: Poor Confusion: None  Danger to Self/Others  Danger to Self Current suicidal ideation?: Denies Description of Suicide Plan: (not recorded) Self-Injurious Behavior: (not recorded) Agreement Not to Harm Self: (not recorded) Description of Agreement: (not recorded) Danger to Others: None reported or observed

## 2023-11-09 NOTE — Group Note (Signed)
 Date:  11/09/2023 Time:  9:55 AM  Group Topic/Focus:  Goals Group:   The focus of this group is to help patients establish daily goals to achieve during treatment and discuss how the patient can incorporate goal setting into their daily lives to aide in recovery.    Participation Level:  Did Not Attend  Participation Quality:  N/A  Affect:  N/A  Cognitive:  N/A  Insight: None  Engagement in Group:  None  Modes of Intervention:  N/A  Additional Comments:  Patient did not attend goals group.  Kristi HERO Cal Gindlesperger 11/09/2023, 9:55 AM
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
## 2023-11-09 NOTE — Group Note (Signed)
 Recreation Therapy Group Note   Group Topic:Team Building  Group Date: 11/09/2023 Start Time: 0935 End Time: 1013 Facilitators: Tobey Lippard-McCall, LRT,CTRS Location: [address] Dayroom   Group Topic: Communication, Team Building, Problem Solving  Goal Area(s) Addresses:  Patient will effectively work with peer towards shared goal.  Patient will identify skills used to make activity successful.  Patient will identify how skills used during activity can be used to reach post d/c goals.   Behavioral Response:   Intervention: STEM Activity  Activity: Straw Bridge. In teams of 3-5, patients were given 15 plastic drinking straws and an equal length of masking tape. Using the materials provided, patients were instructed to build a free standing bridge-like structure to suspend an everyday item (ex: puzzle box) off of the floor or table surface. All materials were required to be used by the team in their design. LRT facilitated post-activity discussion reviewing team process. Patients were encouraged to reflect how the skills used in this activity can be generalized to daily life post discharge.   Education: Pharmacist, community, Scientist, physiological, Discharge Planning   Education Outcome: Acknowledges education/In group clarification offered/Needs additional education.    Affect/Mood: N/A   Participation Level: Did not attend    Clinical Observations/Individualized Feedback:      Plan: Continue to engage patient in RT group sessions 2-3x/week.   Rolf Fells-McCall, LRT,CTRS  11/09/2023 12:28 PM

## 2023-11-09 NOTE — Group Note (Signed)
 Date:  11/09/2023 Time:  7:28 PM  Group Topic/Focus:  Developing a Wellness Toolbox:   The focus of this group is to help patients develop a wellness toolbox with skills and strategies to promote recovery upon discharge. Emotional Education:   The focus of this group is to discuss what feelings/emotions are, and how they are experienced.  Pt explore interventions such as exercise, medication management and talk therapy to maintain therapy when d/c for healthy living in community.      Participation Level:  Minimal  Participation Quality:  Attentive  Affect:  Flat  Cognitive:  Alert and Appropriate  Insight: Appropriate  Engagement in Group:  Engaged, Improving, and Supportive  Modes of Intervention:  Discussion and Education  Additional Comments:   Gary Sanders 11/09/2023, 7:28 PM

## 2023-11-09 NOTE — Progress Notes (Signed)
(  Sleep Hours) -7.75 (Any PRNs that were needed, meds refused, or side effects to meds)- none (Any disturbances and when (visitation, over night)-none (Concerns raised by the patient)- none (SI/HI/AVH)-denies all

## 2023-11-09 NOTE — Progress Notes (Signed)
 D: Patient is alert, oriented, and mostly cooperative. Denies SI, HI, AVH, and verbally contracts for safety. Patient reports he slept fair last night with sleeping medication. Patient reports his appetite as good, energy level as low, and concentration as good. Patient rates his depression 0/10, hopelessness 0/10, and anxiety 0/10. Patient denies physical symptoms/pain. Patient went to meals and recreation time outside, otherwise he has been in his room.    A: Patient refused scheduled risperdal this morning. Support provided. Patient educated on safety on the unit and medications. Routine safety checks every 15 minutes. Patient stated understanding to tell nurse about any new physical symptoms. Patient understands to tell staff of any needs.     R: No adverse drug reactions noted. Patient remains safe at this time and will continue to monitor.    11/09/23 1000  Psych Admission Type (Psych Patients Only)  Admission Status Involuntary  Psychosocial Assessment  Patient Complaints None  Eye Contact Fair  Facial Expression Flat  Affect Appropriate to circumstance  Speech Logical/coherent  Interaction Assertive  Motor Activity Other (Comment) (WNL)  Appearance/Hygiene Unremarkable  Behavior Characteristics Appropriate to situation  Mood Apprehensive  Thought Process  Coherency WDL  Content WDL  Delusions None reported or observed  Perception WDL  Hallucination None reported or observed  Judgment Poor  Confusion None  Danger to Self  Current suicidal ideation? Denies  Self-Injurious Behavior No self-injurious ideation or behavior indicators observed or expressed   Agreement Not to Harm Self Yes  Description of Agreement verbal  Danger to Others  Danger to Others None reported or observed

## 2023-11-09 NOTE — Plan of Care (Signed)
   Problem: Education: Goal: Knowledge of [facility] Education information/materials will improve Outcome: Progressing Goal: Emotional status will improve Outcome: Progressing Goal: Mental status will improve Outcome: Progressing Goal: Verbalization of understanding the information provided will improve Outcome: Progressing

## 2023-11-09 NOTE — Plan of Care (Signed)

## 2023-11-09 NOTE — Group Note (Signed)
 Date:  11/10/2023 Time:  1:46 AM  Group Topic/Focus:  Wrap-Up Group:   The focus of this group is to help patients review their daily goal of treatment and discuss progress on daily workbooks.    Participation Level:  Active  Participation Quality:  Appropriate and Sharing  Affect:  Appropriate  Cognitive:  Appropriate  Insight: Appropriate  Engagement in Group:  Engaged  Modes of Intervention:  Activity and Socialization  Additional Comments:  Patients shared one high point and low point from today. Patient's high point, seeing his day at visitation. Patient did not have a low point. Patient rated his day a 10 out of 10 and stated that he would not change anything about day. Patient's goal for today, keep it together during visitation.   [name] 11/10/2023, 1:46 AM

## 2023-11-10 NOTE — Group Note (Unsigned)
 Date:  11/10/2023 Time:  8:42 PM  Group Topic/Focus:  Wrap-Up Group:   The focus of this group is to help patients review their daily goal of treatment and discuss progress on daily workbooks.     Participation Level:  {BHH PARTICIPATION OZCZO:77735}  Participation Quality:  {BHH PARTICIPATION QUALITY:22265}  Affect:  {BHH AFFECT:22266}  Cognitive:  {BHH COGNITIVE:22267}  Insight: {BHH Insight2:20797}  Engagement in Group:  {BHH ENGAGEMENT IN HMNLE:77731}  Modes of Intervention:  {BHH MODES OF INTERVENTION:22269}  Additional Comments:  ***  Gary Sanders 11/10/2023, 8:42 PM

## 2023-11-10 NOTE — Plan of Care (Signed)

## 2023-11-10 NOTE — Progress Notes (Signed)
   11/10/23 2130  Psych Admission Type (Psych Patients Only)  Admission Status Involuntary  Psychosocial Assessment  Patient Complaints None  Eye Contact Fair  Facial Expression Flat  Affect Appropriate to circumstance  Speech Logical/coherent  Interaction Assertive  Motor Activity Other (Comment) (wnl)  Appearance/Hygiene Unremarkable  Behavior Characteristics Cooperative;Appropriate to situation  Mood Pleasant  Thought Process  Coherency WDL  Content WDL  Delusions None reported or observed  Perception WDL  Hallucination None reported or observed  Judgment Poor  Confusion None  Danger to Self  Current suicidal ideation? Denies

## 2023-11-10 NOTE — Group Note (Signed)
 Date:  11/10/2023 Time:  4:54 PM  Group Topic/Focus:  Making Healthy Choices:   The focus of this group is to help patients identify negative/unhealthy choices they were using prior to admission and identify positive/healthier coping strategies to replace them upon discharge. Self Care:   The focus of this group is to help patients understand the importance of self-care and sleep hygiene in order to improve or restore emotional, physical, spiritual, interpersonal, and financial health.    Participation Level:  Active  Participation Quality:  Attentive and Sharing  Affect:  Appropriate  Cognitive:  Alert, Appropriate, and Oriented  Insight: Good  Engagement in Group:  Engaged  Modes of Intervention:  Discussion   Powell JAYSON Sharps 11/10/2023, 4:54 PM
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
## 2023-11-10 NOTE — Progress Notes (Signed)
 D: Patient is alert, oriented, and mostly cooperative. Denies SI, HI, AVH, and verbally contracts for safety. Patient has been out of his room more today and has attended groups. Patient reports he slept fair last night with sleeping medication. Patient reports his appetite as good, energy level as normal, and concentration as good. Patient rates his depression 0/10, hopelessness 0/10, and anxiety 0/10. Patient denies physical symptoms/pain.   A: Patient refused scheduled risperdal this morning. Support provided. Patient educated on safety on the unit and medications. Routine safety checks every 15 minutes. Patient stated understanding to tell nurse about any new physical symptoms. Patient understands to tell staff of any needs.     R: No adverse drug reactions noted. Patient remains safe at this time and will continue to monitor.    11/10/23 1000  Psych Admission Type (Psych Patients Only)  Admission Status Involuntary  Psychosocial Assessment  Patient Complaints None  Eye Contact Fair  Facial Expression Flat  Affect Appropriate to circumstance  Speech Logical/coherent  Interaction Assertive  Motor Activity Other (Comment) (WNL)  Appearance/Hygiene Unremarkable  Behavior Characteristics Calm  Mood Apprehensive  Thought Process  Coherency WDL  Content WDL  Delusions None reported or observed  Perception WDL  Hallucination None reported or observed  Judgment Poor  Confusion None  Danger to Self  Current suicidal ideation? Denies  Self-Injurious Behavior No self-injurious ideation or behavior indicators observed or expressed   Agreement Not to Harm Self Yes  Description of Agreement verbal  Danger to Others  Danger to Others None reported or observed

## 2023-11-10 NOTE — Group Note (Signed)
 Date:  11/10/2023 Time:  10:20 AM  Group Topic/Focus: Social Wellness. Gratitude and Strengths  Psychoeducation on the benefits of gratitude and strengths-based thinking for social and emotional wellness. Guided group discussion: "What are you grateful for today?" Strengths identification activity: Participants listed 3 personal strengths and shared examples of when these were demonstrated. Partnered peer sharing to deepen connection and practice active listening. Group reflection: How gratitude and strengths can be used in daily life to improve social interactions.     Participation Level:  Active  Participation Quality:  Appropriate  Affect:  Appropriate  Cognitive:  Appropriate  Insight: Appropriate  Engagement in Group:  Engaged  Modes of Intervention:  Discussion, Socialization, and Support  Additional Comments:    Sherral Dirocco R Alisha Bacus 11/10/2023, 10:20 AM

## 2023-11-10 NOTE — [facility] Group Notes (Signed)
 [facility] Group Notes:  (Nursing/MHT/Case Management/Adjunct)  Date:  11/10/2023  Time:  2000  Type of Therapy:  Wrap up group  Participation Level:  Active  Participation Quality:  Appropriate, Attentive, Sharing, and Supportive  Affect:  Appropriate  Cognitive:  Alert  Insight:  Improving  Engagement in Group:  Developing/Improving  Modes of Intervention:  Clarification, Education, and Support  Summary of Progress/Problems: Positive thinking and positive change were discussed.   Lenora Shaver S 11/10/2023, 10:28 PM

## 2023-11-10 NOTE — Plan of Care (Signed)
  Problem: Education: Goal: Mental status will improve Outcome: Progressing   Problem: Activity: Goal: Interest or engagement in activities will improve Outcome: Progressing   Problem: Education: Goal: Mental status will improve Outcome: Progressing   Problem: Activity: Goal: Interest or engagement in activities will improve Outcome: Progressing

## 2023-11-10 NOTE — Group Note (Signed)
 Date:  11/10/2023 Time:  9:41 AM  Group Topic/Focus:  Goals Group:   The focus of this group is to help patients establish daily goals to achieve during treatment and discuss how the patient can incorporate goal setting into their daily lives to aide in recovery. Orientation:   The focus of this group is to educate the patient on the purpose and policies of crisis stabilization and provide a format to answer questions about their admission.  The group details unit policies and expectations of patients while admitted.    Participation Level:  Active  Participation Quality:  Appropriate  Affect:  Appropriate  Cognitive:  Appropriate  Insight: Appropriate  Engagement in Group:  Engaged  Modes of Intervention:  Discussion  Additional Comments:    Timofey Carandang R Jameah Rouser 11/10/2023, 9:41 AM

## 2023-11-10 NOTE — Progress Notes (Signed)
(  Sleep Hours) -7.0 (Any PRNs that were needed, meds refused, or side effects to meds)- none (Any disturbances and when (visitation, over night)-none (Concerns raised by the patient)- none (SI/HI/AVH)- denies all

## 2023-11-11 DIAGNOSIS — F19950 Other psychoactive substance use, unspecified with psychoactive substance-induced psychotic disorder with delusions: Secondary | ICD-10-CM

## 2023-11-11 DIAGNOSIS — F1220 Cannabis dependence, uncomplicated: Secondary | ICD-10-CM

## 2023-11-11 DIAGNOSIS — F29 Unspecified psychosis not due to a substance or known physiological condition: Principal | ICD-10-CM

## 2023-11-11 MED ORDER — RISPERIDONE 2 MG PO TABS: 2.0000 mg | ORAL_TABLET | Freq: Every day | ORAL | 0 refills | Status: AC

## 2023-11-11 NOTE — Progress Notes (Signed)
 Patient discharged off unit at 1015. Patient belongings reviewed and acknowledged by patient. AVS and Transition Record reviewed and acknowledged by patient. Safety plan completed by patient, reviewed by nurse with patient and copy provided. Any medications and or prescriptions necessary for discharge addressed and provided to patient. Patient denies SI, plan or intent. Denies HI. Denies AVH. No observed or reported side effects to medication. No observed or reported agitation, aggression, or other acute emotional distress. No reported or observed physical abnormalities or concerns. Patient transportation from facility verified and observed.

## 2023-11-11 NOTE — [facility] Suicide Risk Assessment (Signed)
 [facility] INPATIENT:  Family/Significant Other Suicide Prevention Education  Suicide Prevention Education:  Education Completed; Gary Sanders (father), (on -campus [phone]) has been identified by the patient as the family member/significant other with whom the patient will be residing, and identified as the person(s) who will aid the patient in the event of a mental health crisis (suicidal ideations/suicide attempt).  With written consent from the patient, the family member/significant other has been provided the following suicide prevention education, prior to the and/or following the discharge of the patient.  The suicide prevention education provided includes the following: Suicide risk factors Suicide prevention and interventions National Suicide Hotline telephone number [phone] [facility] assessment telephone number [facility] Emergency Assistance [facility] and/or Residential Mobile Crisis Unit telephone number  Request made of family/significant other to: Remove weapons (e.g., guns, rifles, knives), all items previously/currently identified as safety concern.   Remove drugs/medications (over-the-counter, prescriptions, illicit drugs), all items previously/currently identified as a safety concern.  The family member verbalizes understanding of the suicide prevention education information provided.  The family member/significant other agrees to remove the items of safety concern listed above.  *Mr. Gary Sanders reports patient no longer has firearms in his home. Mr. Gary Sanders states willingness to purchase a safe for his home to secure personal firearms.   Gary Sanders [place] 11/11/2023, 10:15 AM

## 2023-11-11 NOTE — Progress Notes (Addendum)
  [facility] Adult Case Management Discharge Plan :  Will you be returning to the same living situation after discharge:  Yes,  patient will return home.  At discharge, do you have transportation home?: Yes,  patients family to transport.  Do you have the ability to pay for your medications: Yes,  patient has insurance.   Release of information consent forms completed and in the chart;  Patient's signature needed at discharge.  Patient to Follow up at:  Follow-up Information     [facility] Follow up on 11/16/2023.   Why: You have a hospital follow up appointment for therapy and medication management services on 10/10 at 8:30 am.  The appointment will be Virtual, telehealth. Contact information: [address]  [address] [place] [address] [phone]         [facility]. Schedule an appointment as soon as possible for a visit.   Specialty: Behavioral Health Why: Please contact this provider if you wish to receive substance abuse intensive outpatient therapy services (SAIOP). Contact information: [address] [place]  [address] [phone]                Next level of care provider has access to [facility] Link:no  Safety Planning and Suicide Prevention discussed: Yes,  Brena Delice Gatlin, [phone]  Zell Mose on-campus [phone].   Has patient been referred to the Quitline?: Patient refused referral for treatment  Patient has been referred for addiction treatment: Patient refused referral for treatment.  [address] LCSWA 11/11/2023, 8:30 AM

## 2023-11-11 NOTE — Group Note (Signed)
 Date:  11/11/2023 Time:  10:35 AM  Group Topic/Focus:  Emotional Education:   The focus of this group is to discuss what feelings/emotions are, and how they are experienced.    Participation Level:  Minimal  Participation Quality:  Appropriate  Affect:  Appropriate  Cognitive:  Appropriate  Insight: Appropriate  Engagement in Group:  Limited  Modes of Intervention:  Activity and Education  Additional Comments:    Kimball Appleby R Nuvia Hileman 11/11/2023, 10:35 AM

## 2023-11-11 NOTE — [facility] Suicide Risk Assessment (Signed)
 [facility] Discharge Suicide Risk Assessment   Principal Problem: Substance-induced psychotic disorder with delusions [facility]) Discharge Diagnoses: Principal Problem:   Substance-induced psychotic disorder with delusions (HCC) Active Problems:   Cannabis use disorder, severe, in controlled environment [facility])  Suicide risk assessment: The patient presented with acute risk for suicide including AMS/psychosis. The patient was admitted under IVC for assessment and monitoring with medications offered to address acute risk factor with quick resolution of symptoms. Although the patient refused medicine after 2 days, he remained largely linear, organized, and free from overt psychotic symptoms. Additional chronic, non-modifiable risk factors for suicide include male gender, recent history of substance use (cannabis only). Protective factors include no prior admissions, no current or prior suicidal thoughts, no prior suicide attempts. He is domiciled with source of income, help seeking, has support of family/community and is expressing clear future orientation. He has demonstrated good insight into substances as contributing to change in behaviors and admission.  At this time patient's current and short-term risk of suicide is considered low. Main mitigating factor to reduce future risk is sobriety, which is best addressed on an outpatient basis. The most appropriate and least restrictive environment is outpatient follow up and he has been scheduled with appointments as noted below.      Follow-up Information     [facility] Follow up on 11/16/2023.   Why: You have a hospital follow up appointment for therapy and medication management services on 10/10 at 8:30 am.  The appointment will be Virtual, telehealth. Contact information: [address]  [address] [place] [address] [phone]         [facility]. Schedule an appointment as soon as possible for a visit.   Specialty:  Behavioral Health Why: Please contact this provider if you wish to receive substance abuse intensive outpatient therapy services (SAIOP). Contact information: [address] [place]  [address] [phone]                 Leita LOISE Arts, MD 11/11/2023, 7:36 AM

## 2023-11-11 NOTE — Group Note (Signed)
 Date:  11/11/2023 Time:  10:14 AM  Group Topic/Focus:  Goals Group:   The focus of this group is to help patients establish daily goals to achieve during treatment and discuss how the patient can incorporate goal setting into their daily lives to aide in recovery. Dimensions of Wellness:   The focus of this group is to introduce the topic of wellness and discuss the role each dimension of wellness plays in total health.  Participation Level:  Active  Participation Quality:  Appropriate  Affect:  Appropriate  Cognitive:  Appropriate  Insight: Appropriate  Engagement in Group:  Engaged  Modes of Intervention:  Activity  Additional Comments:    Suhan Paci R Tylia Ewell 11/11/2023, 10:14 AM

## 2023-11-11 NOTE — Discharge Summary (Signed)
 Physician Discharge Summary Note  Patient:  Gary Sanders is an 33 y.o., male MRN:  [id number] DOB:  02/01/1991 Patient phone:  [phone] (home)  Patient address:   [address] [place] [address],  Total Time spent with patient: 35 minutes   Date of Admission:  11/05/2023 Date of Discharge: 11/11/23  Reason for Admission:  Psychosis  Principal Problem: Substance-induced psychotic disorder with delusions [facility]) Discharge Diagnoses: Principal Problem:   Substance-induced psychotic disorder with delusions (HCC) Active Problems:   Cannabis use disorder, severe, in controlled environment (HCC)   Psychosis, unspecified psychosis type (HCC)   Identifying Information and Past Psychiatric History:  The patient is a 33 y.o. male (domiciled by self, attending school) with a medical history of HTN and a psychiatric history most consistent with substance-induced psychosis and cannabis use disorder admitted for bizarre behavior, AMS/psychosis in the setting of heavy cannabis use.  Psychiatric history is limited with no prior admissions for mood or psychotic symptoms. He has previously carried an MDD diagnosis with sertraline tried briefly many years ago and with outpatient therapy prior to admission. Noted to have SI and self-harmed by cutting wrists approximately 10 years ago (unclear SIB vs attempt, did not require hospitalization). Collateral indicated one prior episode of posturing last December with concern for abnormal behavior also in the setting of heavy cannabis use. Between these episodes, no history of disorganization in thoughts, speech, behavior, poor self care or any other known psychosis. No history of mania. On this admission, rapid resolution of significant psychotic symptoms in the setting of heavy cannabis use pointed towards a substance-induced cause of behaviors.   Hospital course: The patient presented to [facility] on 9/28 via police on IVC written by mom due to  threatening and bizarre behavior - muttering things about jumping off the balcony, sweating profusely, intermittently passing out, jamming himself between the wall and cabinet screaming I'm burning. IVC upheld after medical clearance and he was transferred to [facility] for further evaluation.   Patient evaluated on the floor on 9/30. Noted to demonstrate a bizarre affect and overly-intense eye contact, demonstrated some thought blocking and appeared paranoid concerning for active psychotic symptoms. He was additionally demonstrating with some distress, requested ativan several times and was guarded due to expressed fears that we would be holding him in the hospital indefinitely despite frequent explanations of IVC process. He was offered risperidone 1 mg BID to address this, took for approximately 2 days and was switched to 2 mg in the morning on patient request. However, patient quickly began to refuse the medication, citing no need and no plan to continue this outside of the hospital.  Over the first 3-4 days the patient remained irritable, suspicious with examiners and abrasive on interview. Expressing significant paranoia and guarded. However denied SI, HI and AVH, was not clearly RTIS. Was compliant with other aspects of care.  By 10/3 he was noted to be largely linear and logical and able to verbalize cannabis as a cause for recent erratic behavior. Demonstrated insight into his actions being abnormal and need for recuperation. Although we continued to recommend risperidone to aid in clearing mild psychotic symptoms (odd phrases, intense eye contact and paranoia) was not presenting as overtly psychotic despite declining medications. Although risperidone was continued to be recommended he was not presenting as a clear danger to self/others and was not a candidate for involuntary medications. He was held through the entirety of IVC to allow for further clearance of psychotic symptoms with  sobriety.  Interview  today: Today the patient reports he is good. He is looking forward to leaving the hospital today. Continues to report he will not be taking medications outside of the hospital but will be pursuing therapy. This author once again educated about substance-induced psychosis, recommendation for at least a short course of the risperidone and that this will be sent as a paper script. Patient voiced understanding. He denied any additional concerns including SI, HI and AVH.    Behavior on unit: Throughout admission the patient was guarded and suspicious of treatment team, engaged with psychiatry to a very limited degree. Over the first few days appeared disorganized and suspicious, avoided contact with all staff and peers. However, he did not present with any behavioral concerns and did not require PRN medications for agitation throughout the entirety of admission. He took risperidone for 1-2 days before declining medications altogether. However, by day 4 he was demonstrating better engagement with groups and peers, was calmer, and showed improvements in insight in need for hospitalization and goals for after the hospital.   Overall Although some mild paranoia/guardedness and odd eye contact remained he was not overtly demonstrating signs of psychosis and as he was not presenting as a harm to self/others warranting further involuntary hospitalization he was released from IVC. Patient was discharged in stable and improved condition with follow-up scheduled into care of family. He was provided a letter for hospitalization as well as recommendation for 1 week off to continue to allow for clearance of psychotic symptoms. Motivational interview and encouragement of sobriety was done throughout admission and day of discharge. Encouraged patient to present to ED or call crisis in the future should symptoms return.   Past Medical History:  Past Medical History:  Diagnosis Date   HTN (hypertension)    History  reviewed. No pertinent surgical history. Family History: History reviewed. No pertinent family history.  Social History:  Social History   Substance and Sexual Activity  Alcohol Use Yes   Comment: occ     Social History   Substance and Sexual Activity  Drug Use No    Social History   Socioeconomic History   Marital status: Single    Spouse name: Not on file   Number of children: Not on file   Years of education: Not on file   Highest education level: Not on file  Occupational History   Not on file  Tobacco Use   Smoking status: Every Day   Smokeless tobacco: Never  Substance and Sexual Activity   Alcohol use: Yes    Comment: occ   Drug use: No   Sexual activity: Not on file  Other Topics Concern   Not on file  Social History Narrative   Not on file   Social Drivers of Health   Financial Resource Strain: Not on File (10/12/2021)   Received from [employer]    Financial Resource Strain: 0  Food Insecurity: No Food Insecurity (11/05/2023)   Hunger Vital Sign    Worried About Running Out of Food in the Last Year: Never true    Ran Out of Food in the Last Year: Never true  Transportation Needs: No Transportation Needs (11/05/2023)   PRAPARE - Administrator, Civil Service (Medical): No    Lack of Transportation (Non-Medical): No  Physical Activity: Not on File (10/12/2021)   Received from [facility]   Physical Activity    Physical Activity: 0  Stress: Not on  File (10/12/2021)   Received from [facility]   Stress    Stress: 0  Social Connections: Not on File (10/17/2022)   Received from [employer]    Connectedness: 0    Physical Findings:  Mental Status exam: Appearance: tall white male, short-cropped light-colored hair, recgangular glasses on face, seen sitting with peers in day room Eye contact: good - remains intense  Attitude towards examiner cooperative, pleasant  Psychomotor: no agitation or retardation  Speech:  monotonous, normal in amount and tone  Language: no delays  Mood: good Affect: remains restricted  Thought content: denying SI and HI, no overt delusions expressed,  Thought Process: linear and organized  Perception: denying AVH, not overtly RTIS  Insight: fair to good - understanding that behavior was odd and that substance use contributed.   Judgement: fair - reporting intent to stop marijuana. Continuing to decline medications    Orientation: x3 Attention/Concentration: good; attends to interview  Memory/Cognition: grossly intact on conversation  Fund of Knowledge: Average   Musculoskeletal: Strength & Muscle Tone: within normal limits Gait & Station: normal Patient leans: N/A    Physical Exam: Physical Exam Constitutional:      Appearance: Normal appearance.  HENT:     Head: Normocephalic.  Pulmonary:     Effort: Pulmonary effort is normal.  Abdominal:     General: There is distension.  Musculoskeletal:        General: Normal range of motion.     Cervical back: Normal range of motion.  Neurological:     General: No focal deficit present.     Mental Status: He is alert.    Review of Systems  Constitutional: Negative.   HENT: Negative.    Respiratory: Negative.    Cardiovascular: Negative.   Gastrointestinal: Negative.   Musculoskeletal: Negative.   Neurological: Negative.    Blood pressure (!) 133/90, pulse 68, temperature 98.2 F (36.8 C), temperature source Oral, resp. rate 16, height 6' 1 (1.854 m), weight 76.1 kg, SpO2 99%. Body mass index is 22.14 kg/m.   Social History   Tobacco Use  Smoking Status Every Day  Smokeless Tobacco Never   Tobacco Cessation:  N/A - does not use tobacco   Blood Alcohol level:  Lab Results  Component Value Date   [facility] <15 11/04/2023    Metabolic Disorder Labs:  Lab Results  Component Value Date   HGBA1C 4.6 (L) 11/04/2023   MPG 85.32 11/04/2023   No results found for: PROLACTIN Lab Results  Component  Value Date   CHOL 140 11/04/2023   TRIG 26 11/04/2023   HDL 51 11/04/2023   CHOLHDL 2.7 11/04/2023   VLDL 5 11/04/2023   LDLCALC 84 11/04/2023    See Psychiatric Specialty Exam and Suicide Risk Assessment completed by Attending Physician prior to discharge.  Discharge destination:  Home with family    Allergies as of 11/11/2023       Reactions   Amoxicillin Rash        Medication List     STOP taking these medications    meloxicam 15 MG tablet Commonly known as: MOBIC   traZODone 100 MG tablet Commonly known as: DESYREL       TAKE these medications      Indication  risperiDONE 2 MG tablet Commonly known as: RISPERDAL Take 1 tablet (2 mg total) by mouth daily.  Indication: psychosis, unspecified        Follow-up Information     [facility] Follow up on  11/16/2023.   Why: You have a hospital follow up appointment for therapy and medication management services on 10/10 at 8:30 am.  The appointment will be Virtual, telehealth. Contact information: [address]  [address] [place] [address] [phone]         [facility]. Schedule an appointment as soon as possible for a visit.   Specialty: Behavioral Health Why: Please contact this provider if you wish to receive substance abuse intensive outpatient therapy services (SAIOP). Contact information: [address] [place]  [address] [phone]                Signed: Leita LOISE Arts, MD 11/11/2023, 8:13 AM

## 2023-11-11 NOTE — Progress Notes (Signed)
(  Sleep Hours) -7.5 (Any PRNs that were needed, meds refused, or side effects to meds)- none (Any disturbances and when (visitation, over night)-none (Concerns raised by the patient)- none (SI/HI/AVH)- denies all

## 2023-11-15 ENCOUNTER — Other Ambulatory Visit: Payer: Self-pay

## 2023-11-15 ENCOUNTER — Encounter (HOSPITAL_BASED_OUTPATIENT_CLINIC_OR_DEPARTMENT_OTHER): Payer: Self-pay | Admitting: Orthopaedic Surgery

## 2023-11-15 NOTE — Progress Notes (Signed)
   11/15/23 1220  PAT Phone Screen  Is the patient taking a GLP-1 receptor agonist? No  Do You Have Diabetes? No  Do You Have Hypertension? Yes  Have You Ever Been to the ER for Asthma? No  Have You Taken Oral Steroids in the Past 3 Months? No  Do you Take Phenteramine or any Other Diet Drugs? No  Recent  Lab Work, EKG, CXR? Yes  Where was this test performed? EKG 11/14/23 NSR CBC [facility] 9/28  Do you have a history of heart problems? No  Any Recent Hospitalizations? (S)  Yes (Behav health adm for substance induced psychosis and cannabis use d/o 9/25-10/5)  Height 6' 1 (1.854 m)  Weight 73 kg  Pat Appointment Scheduled No  Reason for No Appointment Not Needed   Reviewed recent admission w/ Dr Jefm, Manuelita to proceed as planned at [facility].

## 2023-11-19 NOTE — Anesthesia Preprocedure Evaluation (Signed)
 Anesthesia Evaluation    Airway        Dental   Pulmonary Current Smoker          Cardiovascular hypertension,      Neuro/Psych  PSYCHIATRIC DISORDERS (cannabis-induced psychotic disorder (hospitalized 9/25-10/5))  Depression       GI/Hepatic ,,,(+)     substance abuse  marijuana use  Endo/Other    Renal/GU      Musculoskeletal   Abdominal   Peds  Hematology Lab Results      Component                Value               Date                      WBC                      8.2                 11/04/2023                HGB                      14.3                11/04/2023                HCT                      38.6 (L)            11/04/2023                MCV                      84.3                11/04/2023                PLT                      210                 11/04/2023              Anesthesia Other Findings   Reproductive/Obstetrics                              Anesthesia Physical Anesthesia Plan  ASA: 2  Anesthesia Plan: General   Post-op Pain Management: Regional block* and Tylenol PO (pre-op)*   Induction: Intravenous  PONV Risk Score and Plan: 1 and Dexamethasone, Ondansetron, Midazolam and Treatment may vary due to age or medical condition  Airway Management Planned: Oral ETT  Additional Equipment:   Intra-op Plan:   Post-operative Plan: Extubation in OR  Informed Consent:   Plan Discussed with:   Anesthesia Plan Comments:          Anesthesia Quick Evaluation

## 2023-11-20 NOTE — H&P (View-Only) (Signed)
 PREOPERATIVE H&P  Chief Complaint: left articular cartilage disorder, shoulder instability, biceps tendinitis  HPI: Gary Sanders is a 33 y.o. male who is scheduled for, Procedure(s): REPAIR, SHOULDER, ARTHROSCOPIC, BANKART.   Patient has a past medical history significant for HTN.   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 He has had fourteen years of shoulder pain. He had an instability event when he was in high school. He was seen by Dr. Arvell at that point and he was told he can try and rehab. He has tried over the years therapy based activities to try and get better.  He continues to have pain at this point.  He has trouble with overhead activities. He is unhappy with his function.  Symptoms are rated as moderate to severe, and have been worsening.  This is significantly impairing activities of daily living.    Please see clinic note for further details on this patient's care.    He has elected for surgical management.   Past Medical History:  Diagnosis Date   Cannabis-induced psychotic disorder (HCC) 11/05/2023   HTN (hypertension)    Past Surgical History:  Procedure Laterality Date   DENTAL SURGERY     ORIF ANKLE FRACTURE     Social History   Socioeconomic History   Marital status: Single    Spouse name: Not on file   Number of children: Not on file   Years of education: Not on file   Highest education level: Not on file  Occupational History   Not on file  Tobacco Use   Smoking status: Every Day   Smokeless tobacco: Never  Vaping Use   Vaping status: Every Day   Substances: Nicotine, CBD, Mixture of cannabinoids  Substance and Sexual Activity   Alcohol use: Yes    Comment: occ   Drug use: Yes    Types: Marijuana    Comment: Daily   Sexual activity: Not on file  Other Topics Concern   Not on file  Social History Narrative   Not on file   Social Drivers of Health   Financial Resource Strain: Not on File (10/12/2021)    Received from [facility]    Financial Resource Strain: 0  Food Insecurity: No Food Insecurity (11/05/2023)   Hunger Vital Sign    Worried About Running Out of Food in the Last Year: Never true    Ran Out of Food in the Last Year: Never true  Transportation Needs: No Transportation Needs (11/05/2023)   PRAPARE - Administrator, Civil Service (Medical): No    Lack of Transportation (Non-Medical): No  Physical Activity: Not on File (10/12/2021)   Received from [facility]   Physical Activity    Physical Activity: 0  Stress: Not on File (10/12/2021)   Received from [facility]   Stress    Stress: 0  Social Connections: Not on File (10/17/2022)   Received from [facility]   Social Connections    Connectedness: 0   History reviewed. No pertinent family history. Allergies  Allergen Reactions   Amoxicillin Rash   Prior to Admission medications   Medication Sig Start Date End Date Taking? Authorizing Provider  risperiDONE (RISPERDAL) 2 MG tablet Take 1 tablet (2 mg total) by mouth daily. Patient not taking: Reported on 11/15/2023 11/11/23   Towana Leita SAILOR, MD    ROS: All other systems have been reviewed and were otherwise negative with the exception of those  mentioned in the HPI and as above.  Physical Exam: General: Alert, no acute distress Cardiovascular: No pedal edema Respiratory: No cyanosis, no use of accessory musculature GI: No organomegaly, abdomen is soft and non-tender Skin: No lesions in the area of chief complaint Neurologic: Sensation intact distally Psychiatric: Patient is competent for consent with normal mood and affect Lymphatic: No axillary or cervical lymphadenopathy  MUSCULOSKELETAL:  The range of motion of his shoulder is full. He has intact cuff strength. He has an obvious significant with posterior load and shift. Anterior apprehension signs are equivocal. Distal motor and sensory function intact otherwise intact.   Imaging: MRI  demonstrates a posterior inferior labral tear with a paralabral cyst.  There is also on my review an obvious SLAP tear.  Assessment: left articular cartilage disorder, shoulder instability, biceps tendinitis  Plan: Plan for Procedure(s): REPAIR, SHOULDER, ARTHROSCOPIC, BANKART  The risks benefits and alternatives were discussed with the patient including but not limited to the risks of nonoperative treatment, versus surgical intervention including infection, bleeding, nerve injury,  blood clots, cardiopulmonary complications, morbidity, mortality, among others, and they were willing to proceed.   The patient acknowledged the explanation, agreed to proceed with the plan and consent was signed.   Operative Plan: Left shoulder scope with posterior labral repair and possible biceps tenodesis Discharge Medications: standard DVT Prophylaxis: none Physical Therapy: outpatient PT Special Discharge needs: Sling. Rosalita Aleck LOISE Jennye, PA-C  11/20/2023 10:09 AM

## 2023-11-20 NOTE — H&P (Signed)
 PREOPERATIVE H&P  Chief Complaint: left articular cartilage disorder, shoulder instability, biceps tendinitis  HPI: Gary Sanders is a 33 y.o. male who is scheduled for, Procedure(s): REPAIR, SHOULDER, ARTHROSCOPIC, BANKART.   Patient has a past medical history significant for HTN.   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 He has had fourteen years of shoulder pain. He had an instability event when he was in high school. He was seen by Dr. Arvell at that point and he was told he can try and rehab. He has tried over the years therapy based activities to try and get better.  He continues to have pain at this point.  He has trouble with overhead activities. He is unhappy with his function.  Symptoms are rated as moderate to severe, and have been worsening.  This is significantly impairing activities of daily living.    Please see clinic note for further details on this patient's care.    He has elected for surgical management.   Past Medical History:  Diagnosis Date   Cannabis-induced psychotic disorder (HCC) 11/05/2023   HTN (hypertension)    Past Surgical History:  Procedure Laterality Date   DENTAL SURGERY     ORIF ANKLE FRACTURE     Social History   Socioeconomic History   Marital status: Single    Spouse name: Not on file   Number of children: Not on file   Years of education: Not on file   Highest education level: Not on file  Occupational History   Not on file  Tobacco Use   Smoking status: Every Day   Smokeless tobacco: Never  Vaping Use   Vaping status: Every Day   Substances: Nicotine, CBD, Mixture of cannabinoids  Substance and Sexual Activity   Alcohol use: Yes    Comment: occ   Drug use: Yes    Types: Marijuana    Comment: Daily   Sexual activity: Not on file  Other Topics Concern   Not on file  Social History Narrative   Not on file   Social Drivers of Health   Financial Resource Strain: Not on File (10/12/2021)    Received from [facility]    Financial Resource Strain: 0  Food Insecurity: No Food Insecurity (11/05/2023)   Hunger Vital Sign    Worried About Running Out of Food in the Last Year: Never true    Ran Out of Food in the Last Year: Never true  Transportation Needs: No Transportation Needs (11/05/2023)   PRAPARE - Administrator, Civil Service (Medical): No    Lack of Transportation (Non-Medical): No  Physical Activity: Not on File (10/12/2021)   Received from [facility]   Physical Activity    Physical Activity: 0  Stress: Not on File (10/12/2021)   Received from [facility]   Stress    Stress: 0  Social Connections: Not on File (10/17/2022)   Received from [facility]   Social Connections    Connectedness: 0   History reviewed. No pertinent family history. Allergies  Allergen Reactions   Amoxicillin Rash   Prior to Admission medications   Medication Sig Start Date End Date Taking? Authorizing Provider  risperiDONE (RISPERDAL) 2 MG tablet Take 1 tablet (2 mg total) by mouth daily. Patient not taking: Reported on 11/15/2023 11/11/23   Towana Leita SAILOR, MD    ROS: All other systems have been reviewed and were otherwise negative with the exception of those  mentioned in the HPI and as above.  Physical Exam: General: Alert, no acute distress Cardiovascular: No pedal edema Respiratory: No cyanosis, no use of accessory musculature GI: No organomegaly, abdomen is soft and non-tender Skin: No lesions in the area of chief complaint Neurologic: Sensation intact distally Psychiatric: Patient is competent for consent with normal mood and affect Lymphatic: No axillary or cervical lymphadenopathy  MUSCULOSKELETAL:  The range of motion of his shoulder is full. He has intact cuff strength. He has an obvious significant with posterior load and shift. Anterior apprehension signs are equivocal. Distal motor and sensory function intact otherwise intact.   Imaging: MRI  demonstrates a posterior inferior labral tear with a paralabral cyst.  There is also on my review an obvious SLAP tear.  Assessment: left articular cartilage disorder, shoulder instability, biceps tendinitis  Plan: Plan for Procedure(s): REPAIR, SHOULDER, ARTHROSCOPIC, BANKART  The risks benefits and alternatives were discussed with the patient including but not limited to the risks of nonoperative treatment, versus surgical intervention including infection, bleeding, nerve injury,  blood clots, cardiopulmonary complications, morbidity, mortality, among others, and they were willing to proceed.   The patient acknowledged the explanation, agreed to proceed with the plan and consent was signed.   Operative Plan: Left shoulder scope with posterior labral repair and possible biceps tenodesis Discharge Medications: standard DVT Prophylaxis: none Physical Therapy: outpatient PT Special Discharge needs: Sling. Rosalita Aleck LOISE Jennye, PA-C  11/20/2023 10:09 AM

## 2023-11-21 NOTE — Discharge Instructions (Signed)
 Bonner Hair MD, MPH Aleck Stalling, PA-C [facility] [address] [phone] (tel)   [phone] (fax)   POST-OPERATIVE INSTRUCTIONS - SHOULDER ARTHROSCOPY  WOUND CARE You may remove the Operative Dressing on Post-Op Day #3 (72hrs after surgery).   Alternatively if you would like you can leave dressing on until follow-up if within 7-8 days but keep it dry. Leave steri-strips in place until they fall off on their own, usually 2 weeks postop. There may be a small amount of fluid/bleeding leaking at the surgical site.  This is normal; the shoulder is filled with fluid during the procedure and can leak for 24-48hrs after surgery.  You may change/reinforce the bandage as needed.  Use the Cryocuff or Ice as often as possible for the first 7 days, then as needed for pain relief. Always keep a towel, ACE wrap or other barrier between the cooling unit and your skin.  You may shower on Post-Op Day #3. Gently pat the area dry.  Do not soak the shoulder in water or submerge it.  Keep incisions as dry as possible. Do not go swimming in the pool or ocean until 4 weeks after surgery or when otherwise instructed.    EXERCISES Wear the sling at all times  You may remove the sling for showering, but keep the arm across the chest or in a secondary sling.     It is normal for your fingers/hand to become more swollen after surgery and discolored from bruising.   This will resolve over the first few weeks usually after surgery. Please continue to ambulate and do not stay sitting or lying for too long.  Perform foot and wrist pumps to assist in circulation.  PHYSICAL THERAPY - You will begin physical therapy soon after surgery (unless otherwise specified) - Please call to set up an appointment, if you do not already have one  - Let our office if there are any issues with scheduling your therapy   REGIONAL ANESTHESIA (NERVE BLOCKS) The anesthesia team may have  performed a nerve block for you this is a great tool used to minimize pain.   The block may start wearing off overnight (between 8-24 hours postop) When the block wears off, your pain may go from nearly zero to the pain you would have had postop without the block. This is an abrupt transition but nothing dangerous is happening.   This can be a challenging period but utilize your as needed pain medications to try and manage this period. We suggest you use the pain medication the first night prior to going to bed, to ease this transition.  You may take an extra dose of narcotic when this happens if needed  POST-OP MEDICATIONS- Multimodal approach to pain control In general your pain will be controlled with a combination of substances.  Prescriptions unless otherwise discussed are electronically sent to your pharmacy.  This is a carefully made plan we use to minimize narcotic use.     Meloxicam  - Anti-inflammatory medication taken on a scheduled basis Acetaminophen  - Non-narcotic pain medicine taken on a scheduled basis. Next dose of Tylenol  due at 3:10pm today. Oxycodone  - This is a strong narcotic, to be used only on an "as needed" basis for SEVERE pain. Zofran  - take as needed for nausea   FOLLOW-UP If you develop a Fever (>=101.5), Redness or Drainage from the surgical incision site, please call our office to arrange for an evaluation. Please call the office to schedule  a follow-up appointment for your first post-operative appointment, 7-10 days post-operatively.    HELPFUL INFORMATION   You may be more comfortable sleeping in a semi-seated position the first few nights following surgery.  Keep a pillow propped under the elbow and forearm for comfort.  If you have a recliner type of chair it might be beneficial.  If not that is fine too, but it would be helpful to sleep propped up with pillows behind your operated shoulder as well under your elbow and forearm.  This will reduce pulling on  the suture lines.  When dressing, put your operative arm in the sleeve first.  When getting undressed, take your operative arm out last.  Loose fitting, button-down shirts are recommended.  Often in the first days after surgery you may be more comfortable keeping your operative arm under your shirt and not through the sleeve.  You may return to work/school in the next couple of days when you feel up to it.  Desk work and typing in the sling is fine.  We suggest you use the pain medication the first night prior to going to bed, in order to ease any pain when the anesthesia wears off. You should avoid taking pain medications on an empty stomach as it will make you nauseous.  You should wean off your narcotic medicines as soon as you are able.  Most patients will be off narcotics before their first postop appointment.   Do not drink alcoholic beverages or take illicit drugs when taking pain medications.  It is against the law to drive while taking narcotics.  In some states it is against the law to drive while your arm is in a sling.   Pain medication may make you constipated.  Below are a few solutions to try in this order: Decrease the amount of pain medication if you aren't having pain. Drink lots of decaffeinated fluids. Drink prune juice and/or eat dried prunes  If the first 3 don't work start with additional solutions Take Colace - an over-the-counter stool softener Take Senokot - an over-the-counter laxative Take Miralax - a stronger over-the-counter laxative  For more information including helpful videos and documents visit our website:   [email or web address]    Post Anesthesia Home Care Instructions  Activity: Get plenty of rest for the remainder of the day. A responsible individual must stay with you for 24 hours following the procedure.  For the next 24 hours, DO NOT: -Drive a car -Advertising copywriter -Drink alcoholic beverages -Take any  medication unless instructed by your physician -Make any legal decisions or sign important papers.  Meals: Start with liquid foods such as gelatin or soup. Progress to regular foods as tolerated. Avoid greasy, spicy, heavy foods. If nausea and/or vomiting occur, drink only clear liquids until the nausea and/or vomiting subsides. Call your physician if vomiting continues.  Special Instructions/Symptoms: Your throat may feel dry or sore from the anesthesia or the breathing tube placed in your throat during surgery. If this causes discomfort, gargle with warm salt water. The discomfort should disappear within 24 hours.  If you had a scopolamine patch placed behind your ear for the management of post- operative nausea and/or vomiting:  1. The medication in the patch is effective for 72 hours, after which it should be removed.  Wrap patch in a tissue and discard in the trash. Wash hands thoroughly with soap and water. 2. You may remove the patch earlier than 72 hours if you experience unpleasant side effects  which may include dry mouth, dizziness or visual disturbances. 3. Avoid touching the patch. Wash your hands with soap and water after contact with the patch.   Regional Anesthesia Blocks  1. You may not be able to move or feel the blocked extremity after a regional anesthetic block. This may last may last from 3-48 hours after placement, but it will go away. The length of time depends on the medication injected and your individual response to the medication. As the nerves start to wake up, you may experience tingling as the movement and feeling returns to your extremity. If the numbness and inability to move your extremity has not gone away after 48 hours, please call your surgeon.   2. The extremity that is blocked will need to be protected until the numbness is gone and the strength has returned. Because you cannot feel it, you will need to take extra care to avoid injury. Because it may be  weak, you may have difficulty moving it or using it. You may not know what position it is in without looking at it while the block is in effect.  3. For blocks in the legs and feet, returning to weight bearing and walking needs to be done carefully. You will need to wait until the numbness is entirely gone and the strength has returned. You should be able to move your leg and foot normally before you try and bear weight or walk. You will need someone to be with you when you first try to ensure you do not fall and possibly risk injury.  4. Bruising and tenderness at the needle site are common side effects and will resolve in a few days.  5. Persistent numbness or new problems with movement should be communicated to the surgeon or the [facility] [phone] [facility] [phone]).Information for Discharge Teaching: EXPAREL  (bupivacaine  liposome injectable suspension)   Pain relief is important to your recovery. The goal is to control your pain so you can move easier and return to your normal activities as soon as possible after your procedure. Your physician may use several types of medicines to manage pain, swelling, and more.  Your surgeon or anesthesiologist gave you EXPAREL (bupivacaine ) to help control your pain after surgery.  EXPAREL  is a local anesthetic designed to release slowly over an extended period of time to provide pain relief by numbing the tissue around the surgical site. EXPAREL  is designed to release pain medication over time and can control pain for up to 72 hours. Depending on how you respond to EXPAREL , you may require less pain medication during your recovery. EXPAREL  can help reduce or eliminate the need for opioids during the first few days after surgery when pain relief is needed the most. EXPAREL  is not an opioid and is not addictive. It does not cause sleepiness or sedation.   Important! A teal colored band has been placed on your arm  with the date, time and amount of EXPAREL  you have received. Please leave this armband in place for the full 96 hours following administration, and then you may remove the band. If you return to the hospital for any reason within 96 hours following the administration of EXPAREL , the armband provides important information that your health care providers to know, and alerts them that you have received this anesthetic.    Possible side effects of EXPAREL : Temporary loss of sensation or ability to move in the area where medication was injected. Nausea, vomiting, constipation Rarely,  numbness and tingling in your mouth or lips, lightheadedness, or anxiety may occur. Call your doctor right away if you think you may be experiencing any of these sensations, or if you have other questions regarding possible side effects.  Follow all other discharge instructions given to you by your surgeon or nurse. Eat a healthy diet and drink plenty of water or other fluids.DonJoy Ultrasling IV/Ultrasling IIIER:  Please contact your surgeon if you have questions or concerns about your sling.

## 2023-11-22 ENCOUNTER — Encounter (HOSPITAL_BASED_OUTPATIENT_CLINIC_OR_DEPARTMENT_OTHER): Payer: Self-pay | Admitting: Anesthesiology

## 2023-11-22 ENCOUNTER — Ambulatory Visit (HOSPITAL_BASED_OUTPATIENT_CLINIC_OR_DEPARTMENT_OTHER): Admission: RE | Admit: 2023-11-22 | Source: Home / Self Care | Admitting: Orthopaedic Surgery

## 2023-11-22 SURGERY — REPAIR, SHOULDER, ARTHROSCOPIC, BANKART
Anesthesia: General | Laterality: Left

## 2023-11-26 ENCOUNTER — Other Ambulatory Visit: Payer: Self-pay

## 2023-11-26 ENCOUNTER — Encounter (HOSPITAL_BASED_OUTPATIENT_CLINIC_OR_DEPARTMENT_OTHER): Payer: Self-pay | Admitting: Orthopaedic Surgery

## 2023-11-29 ENCOUNTER — Encounter (HOSPITAL_BASED_OUTPATIENT_CLINIC_OR_DEPARTMENT_OTHER): Payer: Self-pay | Admitting: Orthopaedic Surgery

## 2023-11-29 ENCOUNTER — Ambulatory Visit (HOSPITAL_BASED_OUTPATIENT_CLINIC_OR_DEPARTMENT_OTHER): Admitting: Anesthesiology

## 2023-11-29 ENCOUNTER — Ambulatory Visit (HOSPITAL_BASED_OUTPATIENT_CLINIC_OR_DEPARTMENT_OTHER)
Admission: RE | Admit: 2023-11-29 | Discharge: 2023-11-29 | Disposition: A | Discharge status: Home / Self Care | Attending: Orthopaedic Surgery | Admitting: Orthopaedic Surgery

## 2023-11-29 ENCOUNTER — Encounter (HOSPITAL_BASED_OUTPATIENT_CLINIC_OR_DEPARTMENT_OTHER)
Admission: RE | Disposition: A | Discharge status: Home / Self Care | Payer: Self-pay | Source: Home / Self Care | Attending: Orthopaedic Surgery

## 2023-11-29 ENCOUNTER — Other Ambulatory Visit: Payer: Self-pay

## 2023-11-29 DIAGNOSIS — M25312 Other instability, left shoulder: Secondary | ICD-10-CM | POA: Diagnosis not present

## 2023-11-29 DIAGNOSIS — F17290 Nicotine dependence, other tobacco product, uncomplicated: Secondary | ICD-10-CM | POA: Diagnosis not present

## 2023-11-29 DIAGNOSIS — I10 Essential (primary) hypertension: Secondary | ICD-10-CM | POA: Diagnosis not present

## 2023-11-29 DIAGNOSIS — Z79899 Other long term (current) drug therapy: Secondary | ICD-10-CM | POA: Insufficient documentation

## 2023-11-29 DIAGNOSIS — S43005A Unspecified dislocation of left shoulder joint, initial encounter: Secondary | ICD-10-CM | POA: Diagnosis present

## 2023-11-29 DIAGNOSIS — S43432A Superior glenoid labrum lesion of left shoulder, initial encounter: Secondary | ICD-10-CM | POA: Insufficient documentation

## 2023-11-29 DIAGNOSIS — F32A Depression, unspecified: Secondary | ICD-10-CM | POA: Insufficient documentation

## 2023-11-29 DIAGNOSIS — M7522 Bicipital tendinitis, left shoulder: Secondary | ICD-10-CM | POA: Diagnosis not present

## 2023-11-29 DIAGNOSIS — M24112 Other articular cartilage disorders, left shoulder: Secondary | ICD-10-CM | POA: Diagnosis present

## 2023-11-29 HISTORY — PX: BANKART REPAIR: SHX5173

## 2023-11-29 SURGERY — REPAIR, SHOULDER, ARTHROSCOPIC, BANKART
Anesthesia: General | Site: Shoulder | Laterality: Left

## 2023-11-29 MED ORDER — MIDAZOLAM HCL 2 MG/2ML IJ SOLN
INTRAMUSCULAR | Status: AC
  Filled 2023-11-29: qty 2

## 2023-11-29 MED ORDER — MIDAZOLAM HCL (PF) 2 MG/2ML IJ SOLN: 2.0000 mg | Freq: Once | INTRAMUSCULAR | Status: DC

## 2023-11-29 MED ORDER — OXYCODONE HCL 5 MG PO TABS: 5.0000 mg | ORAL_TABLET | Freq: Once | ORAL | Status: DC | PRN

## 2023-11-29 MED ORDER — CEFAZOLIN SODIUM-DEXTROSE 2-4 GM/100ML-% IV SOLN
INTRAVENOUS | Status: AC
  Filled 2023-11-29: qty 100

## 2023-11-29 MED ORDER — ROCURONIUM BROMIDE 10 MG/ML (PF) SYRINGE
PREFILLED_SYRINGE | INTRAVENOUS | Status: AC
  Filled 2023-11-29: qty 10

## 2023-11-29 MED ORDER — CEFAZOLIN SODIUM-DEXTROSE 2-4 GM/100ML-% IV SOLN
2.0000 g | INTRAVENOUS | Status: AC
  Administered 2023-11-29: 2 g via INTRAVENOUS

## 2023-11-29 MED ORDER — LIDOCAINE HCL (CARDIAC) PF 100 MG/5ML IV SOSY
PREFILLED_SYRINGE | INTRAVENOUS | Status: DC | PRN
  Administered 2023-11-29: 40 mg via INTRAVENOUS

## 2023-11-29 MED ORDER — ONDANSETRON HCL 4 MG PO TABS: 4.0000 mg | ORAL_TABLET | Freq: Three times a day (TID) | ORAL | 0 refills | Status: AC | PRN

## 2023-11-29 MED ORDER — ACETAMINOPHEN 500 MG PO TABS: 1000.0000 mg | ORAL_TABLET | Freq: Three times a day (TID) | ORAL | 0 refills | Status: AC

## 2023-11-29 MED ORDER — BUPIVACAINE-EPINEPHRINE (PF) 0.5% -1:200000 IJ SOLN
INTRAMUSCULAR | Status: DC | PRN
  Administered 2023-11-29: 15 mL via PERINEURAL

## 2023-11-29 MED ORDER — TRANEXAMIC ACID-NACL 1000-0.7 MG/100ML-% IV SOLN
INTRAVENOUS | Status: AC
Start: 2023-11-29 — End: 2023-11-29
  Filled 2023-11-29: qty 100

## 2023-11-29 MED ORDER — PROPOFOL 10 MG/ML IV BOLUS
INTRAVENOUS | Status: AC
  Filled 2023-11-29: qty 20

## 2023-11-29 MED ORDER — FENTANYL CITRATE (PF) 100 MCG/2ML IJ SOLN
25.0000 ug | INTRAMUSCULAR | Status: DC | PRN
  Administered 2023-11-29: 50 ug via INTRAVENOUS

## 2023-11-29 MED ORDER — PROPOFOL 10 MG/ML IV BOLUS
INTRAVENOUS | Status: DC | PRN
  Administered 2023-11-29: 150 mg via INTRAVENOUS

## 2023-11-29 MED ORDER — GABAPENTIN 300 MG PO CAPS
300.0000 mg | ORAL_CAPSULE | Freq: Once | ORAL | Status: AC
  Administered 2023-11-29: 300 mg via ORAL

## 2023-11-29 MED ORDER — FENTANYL CITRATE (PF) 100 MCG/2ML IJ SOLN
INTRAMUSCULAR | Status: AC
  Filled 2023-11-29: qty 2

## 2023-11-29 MED ORDER — LIDOCAINE 2% (20 MG/ML) 5 ML SYRINGE
INTRAMUSCULAR | Status: AC
  Filled 2023-11-29: qty 5

## 2023-11-29 MED ORDER — BUPIVACAINE LIPOSOME 1.3 % IJ SUSP
INTRAMUSCULAR | Status: DC | PRN
  Administered 2023-11-29: 10 mL via PERINEURAL

## 2023-11-29 MED ORDER — ONDANSETRON HCL 4 MG/2ML IJ SOLN
INTRAMUSCULAR | Status: DC | PRN
  Administered 2023-11-29: 4 mg via INTRAVENOUS

## 2023-11-29 MED ORDER — FENTANYL CITRATE (PF) 100 MCG/2ML IJ SOLN
INTRAMUSCULAR | Status: DC | PRN
  Administered 2023-11-29: 50 ug via INTRAVENOUS

## 2023-11-29 MED ORDER — GABAPENTIN 300 MG PO CAPS
ORAL_CAPSULE | ORAL | Status: AC
  Filled 2023-11-29: qty 1

## 2023-11-29 MED ORDER — MELOXICAM 7.5 MG PO TABS: 7.5000 mg | ORAL_TABLET | Freq: Two times a day (BID) | ORAL | 0 refills | Status: AC

## 2023-11-29 MED ORDER — DEXMEDETOMIDINE HCL IN NACL 80 MCG/20ML IV SOLN
INTRAVENOUS | Status: DC | PRN
  Administered 2023-11-29: 12 ug via INTRAVENOUS

## 2023-11-29 MED ORDER — DEXAMETHASONE SODIUM PHOSPHATE 4 MG/ML IJ SOLN
INTRAMUSCULAR | Status: DC | PRN
  Administered 2023-11-29: 5 mg via INTRAVENOUS

## 2023-11-29 MED ORDER — FENTANYL CITRATE (PF) 100 MCG/2ML IJ SOLN
100.0000 ug | Freq: Once | INTRAMUSCULAR | Status: AC
  Administered 2023-11-29: 100 ug via INTRAVENOUS

## 2023-11-29 MED ORDER — ONDANSETRON HCL 4 MG/2ML IJ SOLN
INTRAMUSCULAR | Status: AC
  Filled 2023-11-29: qty 2

## 2023-11-29 MED ORDER — ACETAMINOPHEN 500 MG PO TABS
1000.0000 mg | ORAL_TABLET | Freq: Once | ORAL | Status: AC
  Administered 2023-11-29: 1000 mg via ORAL

## 2023-11-29 MED ORDER — SUGAMMADEX SODIUM 200 MG/2ML IV SOLN
INTRAVENOUS | Status: DC | PRN
  Administered 2023-11-29: 300 mg via INTRAVENOUS

## 2023-11-29 MED ORDER — FENTANYL CITRATE (PF) 100 MCG/2ML IJ SOLN: 100.0000 ug | Freq: Once | INTRAMUSCULAR | Status: DC

## 2023-11-29 MED ORDER — SODIUM CHLORIDE 0.9 % IR SOLN
Status: DC | PRN
  Administered 2023-11-29 (×2): 3000 mL

## 2023-11-29 MED ORDER — LACTATED RINGERS IV SOLN: INTRAVENOUS | Status: DC

## 2023-11-29 MED ORDER — ROCURONIUM BROMIDE 100 MG/10ML IV SOLN
INTRAVENOUS | Status: DC | PRN
  Administered 2023-11-29: 50 mg via INTRAVENOUS

## 2023-11-29 MED ORDER — OXYCODONE HCL 5 MG/5ML PO SOLN: 5.0000 mg | Freq: Once | ORAL | Status: DC | PRN

## 2023-11-29 MED ORDER — AMISULPRIDE (ANTIEMETIC) 5 MG/2ML IV SOLN: 10.0000 mg | Freq: Once | INTRAVENOUS | Status: DC | PRN

## 2023-11-29 MED ORDER — OXYCODONE HCL 5 MG PO TABS: ORAL_TABLET | ORAL | 0 refills | Status: AC

## 2023-11-29 MED ORDER — ACETAMINOPHEN 500 MG PO TABS
ORAL_TABLET | ORAL | Status: AC
  Filled 2023-11-29: qty 2

## 2023-11-29 MED ORDER — TRANEXAMIC ACID-NACL 1000-0.7 MG/100ML-% IV SOLN
1000.0000 mg | INTRAVENOUS | Status: AC
  Administered 2023-11-29: 1000 mg via INTRAVENOUS

## 2023-11-29 MED ORDER — MIDAZOLAM HCL (PF) 2 MG/2ML IJ SOLN
4.0000 mg | Freq: Once | INTRAMUSCULAR | Status: AC
  Administered 2023-11-29: 4 mg via INTRAVENOUS

## 2023-11-29 SURGICAL SUPPLY — 44 items
ANCHOR SUT 1.8 FIBERTAK SB KL (Anchor) IMPLANT
BLADE EXCALIBUR 4.0X13 (MISCELLANEOUS) ×1 IMPLANT
BUR SURG 4D 13L RD FLUTE (BUR) IMPLANT
CANNULA 5.75X71 LONG (CANNULA) ×1 IMPLANT
CANNULA PASSPORT BUTTON 10-40 (CANNULA) ×1 IMPLANT
CANNULA TWIST IN 8.25X7CM (CANNULA) IMPLANT
CHLORAPREP W/TINT 26 (MISCELLANEOUS) ×1 IMPLANT
CLSR STERI-STRIP ANTIMIC 1/2X4 (GAUZE/BANDAGES/DRESSINGS) ×1 IMPLANT
COOLER ICEMAN CLASSIC (MISCELLANEOUS) ×1 IMPLANT
DRAPE IMP U-DRAPE 54X76 (DRAPES) ×1 IMPLANT
DRAPE INCISE IOBAN 66X45 STRL (DRAPES) IMPLANT
DRAPE SHOULDER BEACH CHAIR (DRAPES) ×1 IMPLANT
DW OUTFLOW CASSETTE/TUBE SET (MISCELLANEOUS) ×1 IMPLANT
GAUZE PAD ABD 8X10 STRL (GAUZE/BANDAGES/DRESSINGS) ×1 IMPLANT
GAUZE SPONGE 4X4 12PLY STRL (GAUZE/BANDAGES/DRESSINGS) ×1 IMPLANT
GLOVE BIO SURGEON STRL SZ 6.5 (GLOVE) ×1 IMPLANT
GLOVE BIOGEL PI IND STRL 6.5 (GLOVE) ×1 IMPLANT
GLOVE BIOGEL PI IND STRL 8 (GLOVE) ×1 IMPLANT
GLOVE ECLIPSE 8.0 STRL XLNG CF (GLOVE) ×1 IMPLANT
GOWN STRL REUS W/ TWL LRG LVL3 (GOWN DISPOSABLE) ×2 IMPLANT
GOWN STRL REUS W/TWL XL LVL3 (GOWN DISPOSABLE) ×1 IMPLANT
KIT PERC INSERT 3.0 KNTLS (KITS) IMPLANT
KIT STR SPEAR 1.8 FBRTK DISP (KITS) IMPLANT
LASSO 90 CVE QUICKPAS (DISPOSABLE) IMPLANT
LASSO CRESCENT QUICKPASS (SUTURE) IMPLANT
MANIFOLD NEPTUNE II (INSTRUMENTS) ×1 IMPLANT
NDL SAFETY ECLIPSE 18X1.5 (NEEDLE) ×1 IMPLANT
PACK ARTHROSCOPY DSU (CUSTOM PROCEDURE TRAY) ×1 IMPLANT
PACK BASIN DAY SURGERY FS (CUSTOM PROCEDURE TRAY) ×1 IMPLANT
PAD COLD SHLDR WRAP-ON (PAD) ×1 IMPLANT
SHEET MEDIUM DRAPE 40X70 STRL (DRAPES) ×1 IMPLANT
SLEEVE ARM SUSPENSION SYSTEM (MISCELLANEOUS) ×1 IMPLANT
SLEEVE SCD COMPRESS KNEE MED (STOCKING) ×1 IMPLANT
SLING S3 LATERAL DISP (MISCELLANEOUS) ×1 IMPLANT
SLING SHLDR PAD UNIV <17 (SOFTGOODS) IMPLANT
SPIKE FLUID TRANSFER (MISCELLANEOUS) IMPLANT
SUT MNCRL AB 4-0 PS2 18 (SUTURE) ×1 IMPLANT
SUTURE FIBERWR #2 38 T-5 BLUE (SUTURE) IMPLANT
SUTURE TAPE TIGERLINK 1.3MM BL (SUTURE) IMPLANT
SYR 5ML LL (SYRINGE) ×1 IMPLANT
TOWEL GREEN STERILE FF (TOWEL DISPOSABLE) ×1 IMPLANT
TUBE CONNECTING 20X1/4 (TUBING) IMPLANT
TUBING ARTHROSCOPY IRRIG 16FT (MISCELLANEOUS) ×1 IMPLANT
WAND ABLATOR APOLLO I90 (BUR) IMPLANT

## 2023-11-29 NOTE — Op Note (Signed)
 Orthopaedic Surgery Operative Note (CSN: [id number])  Gary Sanders  [birth year] Date of Surgery: 11/29/2023   DIAGNOSES: Left shoulder, shoulder instability posterior  POST-OPERATIVE DIAGNOSIS: same  PROCEDURE: Arthroscopic labral repair and capsulorrhaphy - 29806   OPERATIVE FINDING: Patient had full motion with no obvious posterior instability no anterior instability.  Upon examination of the joint he had an intact subscapularis as well as superior cuff.  His biceps was completely normal without SLAP tear and no obvious sign of tearing.  There was an obvious tear of the posterior labrum from the 12:00 to 6 o'clock position.  I was able to stimulate the tissue and then perform a 4 anchor repair.  Good imbrication of tissue and labrum was performed.  Patient will be in a sling for 6 weeks with a external rotation pillow.  Therapy can start in a week to 2 weeks.     Post-operative plan: The patient will be non-weightbearing in a sling .  The patient will be discharged home.  DVT prophylaxis not indicated in ambulatory upper extremity patient without known risk factors.   Pain control with PRN pain medication preferring oral medicines.  Follow up plan will be scheduled in approximately 7 days for incision check.  Surgeons:Primary: Cristy Bonner DASEN, MD Assistants:Caroline McBane, PA-C Location: [facility] OR ROOM 6 Anesthesia: General with Exparel interscalene block Antibiotics: Ancef 2 g Tourniquet time: None Estimated Blood Loss: Minimal Complications: None Specimens: None Implants: Implant Name Type Inv. Item Serial No. Manufacturer Lot No. LRB No. Used Action  ANCHOR SUT 1.8 FIBERTAK SB KL - [id number] Anchor ANCHOR SUT 1.8 FIBERTAK SB KL  [employer] [id number] Left 1 Implanted  ANCHOR SUT 1.8 FIBERTAK SB KL - [id number] Anchor ANCHOR SUT 1.8 FIBERTAK SB KL  [employer] [id number] Left 1 Implanted  ANCHOR SUT 1.8 FIBERTAK SB KL - [id number] Anchor ANCHOR SUT 1.8 FIBERTAK SB KL  [employer]  [id number] Left 1 Implanted  ANCHOR SUT 1.8 FIBERTAK SB KL - [id number] Anchor ANCHOR SUT 1.8 [employer] [id number] Left 1 Implanted    Indications for Surgery:   Gary Sanders is a 33 y.o. male with continued shoulder pain refractory to nonoperative measures for extended period of time.    The risks and benefits were explained at length including but not limited to continued pain, cuff failure, biceps tenodesis failure, stiffness, need for further surgery and infection.   Procedure:   Patient was correctly identified in the preoperative holding area and operative site marked.  Patient brought to OR and positioned beachchair on an [place] table ensuring that all bony prominences were padded and the head was in an appropriate location.  Anesthesia was induced and the operative shoulder was prepped and draped in the usual sterile fashion.  Timeout was called preincision.  A standard posterior viewing portal was made after localizing the portal with a spinal needle.  An anterior accessory portal was also made.  After clearing the articular space the camera was positioned in the subacromial space.  Findings above.    We identified the posterior labral tear.  We debrided bone and cartilage to stimulate the tissue.  At that point we placed four 1.8 fiber tack anchors from the 12:00 to 6 position and used a series of suture lasso's to imbricate tissue and labrum performing a capsulorrhaphy and labral repair.  We had good fixation of the posterior labrum and a stable construct in the case.  The incisions were closed with absorbable monocryl and steri strips.  A sterile dressing was placed along with a sling. The patient was awoken from general anesthesia and taken to the PACU in stable condition without complication.   Gary Stalling, PA-C, present and scrubbed throughout the case, critical for completion in a timely fashion, and for retraction, instrumentation, closure.

## 2023-11-29 NOTE — Interval H&P Note (Signed)
 All questions answered, patient wants to proceed with procedure. ? ?

## 2023-11-29 NOTE — Transfer of Care (Signed)
 Immediate Anesthesia Transfer of Care Note  Patient: Gary Sanders  Procedure(s) Performed: REPAIR, SHOULDER, ARTHROSCOPIC, BANKART (Left: Shoulder)  Patient Location: PACU  Anesthesia Type:General and Regional  Level of Consciousness: awake, alert , and oriented  Airway & Oxygen Therapy: Patient Spontanous Breathing and Patient connected to face mask oxygen  Post-op Assessment: Report given to RN and Post -op Vital signs reviewed and stable  Post vital signs: Reviewed and stable  Last Vitals:  Vitals Value Taken Time  BP 113/82 11/29/23 10:23  Temp 36.6 C 11/29/23 10:23  Pulse 77 11/29/23 10:27  Resp 0 11/29/23 10:27  SpO2 96 % 11/29/23 10:27  Vitals shown include unfiled device data.  Last Pain:  Vitals:   11/29/23 1023  TempSrc:   PainSc: Asleep      Patients Stated Pain Goal: 4 (11/29/23 0730)  Complications: No notable events documented.

## 2023-11-29 NOTE — Progress Notes (Signed)
AssistedDr. Rob Fitzgerald with left, interscalene , ultrasound guided block. Side rails up, monitors on throughout procedure. See vital signs in flow sheet. Tolerated Procedure well.  

## 2023-11-29 NOTE — Discharge Instructions (Addendum)
 Bonner Hair MD, MPH Aleck Stalling, PA-C [facility] [address], [address] [phone] (tel)   [phone] (fax)   POST-OPERATIVE INSTRUCTIONS - SHOULDER ARTHROSCOPY  WOUND CARE You may remove the Operative Dressing on Post-Op Day #3 (72hrs after surgery).   Alternatively if you would like you can leave dressing on until follow-up if within 7-8 days but keep it dry. Leave steri-strips in place until they fall off on their own, usually 2 weeks postop. There may be a small amount of fluid/bleeding leaking at the surgical site.  This is normal; the shoulder is filled with fluid during the procedure and can leak for 24-48hrs after surgery.  You may change/reinforce the bandage as needed.  Use the Cryocuff or Ice as often as possible for the first 7 days, then as needed for pain relief. Always keep a towel, ACE wrap or other barrier between the cooling unit and your skin.  You may shower on Post-Op Day #3. Gently pat the area dry.  Do not soak the shoulder in water or submerge it.  Keep incisions as dry as possible. Do not go swimming in the pool or ocean until 4 weeks after surgery or when otherwise instructed.    EXERCISES Wear the sling at all times  You may remove the sling for showering, but keep the arm across the chest or in a secondary sling.     It is normal for your fingers/hand to become more swollen after surgery and discolored from bruising.   This will resolve over the first few weeks usually after surgery. Please continue to ambulate and do not stay sitting or lying for too long.  Perform foot and wrist pumps to assist in circulation.  PHYSICAL THERAPY - You will begin physical therapy soon after surgery (unless otherwise specified) - Please call to set up an appointment, if you do not already have one  - Let our office if there are any issues with scheduling your therapy    REGIONAL ANESTHESIA (NERVE BLOCKS) The anesthesia team may have  performed a nerve block for you this is a great tool used to minimize pain.   The block may start wearing off overnight (between 8-24 hours postop) When the block wears off, your pain may go from nearly zero to the pain you would have had postop without the block. This is an abrupt transition but nothing dangerous is happening.   This can be a challenging period but utilize your as needed pain medications to try and manage this period. We suggest you use the pain medication the first night prior to going to bed, to ease this transition.  You may take an extra dose of narcotic when this happens if needed  POST-OP MEDICATIONS- Multimodal approach to pain control In general your pain will be controlled with a combination of substances.  Prescriptions unless otherwise discussed are electronically sent to your pharmacy.  This is a carefully made plan we use to minimize narcotic use.     Meloxicam - Anti-inflammatory medication taken on a scheduled basis Acetaminophen - Non-narcotic pain medicine taken on a scheduled basis  Oxycodone - This is a strong narcotic, to be used only on an "as needed" basis for SEVERE pain. Zofran - take as needed for nausea   FOLLOW-UP If you develop a Fever (>=101.5), Redness or Drainage from the surgical incision site, please call our office to arrange for an evaluation. Please call the office to schedule a follow-up appointment for your first  post-operative appointment, 7-10 days post-operatively.    HELPFUL INFORMATION   You may be more comfortable sleeping in a semi-seated position the first few nights following surgery.  Keep a pillow propped under the elbow and forearm for comfort.  If you have a recliner type of chair it might be beneficial.  If not that is fine too, but it would be helpful to sleep propped up with pillows behind your operated shoulder as well under your elbow and forearm.  This will reduce pulling on the suture lines.  When dressing, put  your operative arm in the sleeve first.  When getting undressed, take your operative arm out last.  Loose fitting, button-down shirts are recommended.  Often in the first days after surgery you may be more comfortable keeping your operative arm under your shirt and not through the sleeve.  You may return to work/school in the next couple of days when you feel up to it.  Desk work and typing in the sling is fine.  We suggest you use the pain medication the first night prior to going to bed, in order to ease any pain when the anesthesia wears off. You should avoid taking pain medications on an empty stomach as it will make you nauseous.  You should wean off your narcotic medicines as soon as you are able.  Most patients will be off narcotics before their first postop appointment.   Do not drink alcoholic beverages or take illicit drugs when taking pain medications.  It is against the law to drive while taking narcotics.  In some states it is against the law to drive while your arm is in a sling.   Pain medication may make you constipated.  Below are a few solutions to try in this order: Decrease the amount of pain medication if you aren't having pain. Drink lots of decaffeinated fluids. Drink prune juice and/or eat dried prunes  If the first 3 don't work start with additional solutions Take Colace - an over-the-counter stool softener Take Senokot - an over-the-counter laxative Take Miralax - a stronger over-the-counter laxative  For more information including helpful videos and documents visit our website:   [email or web address]    Post Anesthesia Home Care Instructions  Activity: Get plenty of rest for the remainder of the day. A responsible individual must stay with you for 24 hours following the procedure.  For the next 24 hours, DO NOT: -Drive a car -Advertising copywriter -Drink alcoholic beverages -Take any medication unless instructed by your  physician -Make any legal decisions or sign important papers.  Meals: Start with liquid foods such as gelatin or soup. Progress to regular foods as tolerated. Avoid greasy, spicy, heavy foods. If nausea and/or vomiting occur, drink only clear liquids until the nausea and/or vomiting subsides. Call your physician if vomiting continues.  Special Instructions/Symptoms: Your throat may feel dry or sore from the anesthesia or the breathing tube placed in your throat during surgery. If this causes discomfort, gargle with warm salt water. The discomfort should disappear within 24 hours.  If you had a scopolamine patch placed behind your ear for the management of post- operative nausea and/or vomiting:  1. The medication in the patch is effective for 72 hours, after which it should be removed.  Wrap patch in a tissue and discard in the trash. Wash hands thoroughly with soap and water. 2. You may remove the patch earlier than 72 hours if you experience unpleasant side effects which may include dry mouth, dizziness  or visual disturbances. 3. Avoid touching the patch. Wash your hands with soap and water after contact with the patch.  No Tylenol until after 1:35pm today if needed    Regional Anesthesia Blocks  1. You may not be able to move or feel the blocked extremity after a regional anesthetic block. This may last may last from 3-48 hours after placement, but it will go away. The length of time depends on the medication injected and your individual response to the medication. As the nerves start to wake up, you may experience tingling as the movement and feeling returns to your extremity. If the numbness and inability to move your extremity has not gone away after 48 hours, please call your surgeon.   2. The extremity that is blocked will need to be protected until the numbness is gone and the strength has returned. Because you cannot feel it, you will need to take extra care to avoid injury. Because  it may be weak, you may have difficulty moving it or using it. You may not know what position it is in without looking at it while the block is in effect.  3. For blocks in the legs and feet, returning to weight bearing and walking needs to be done carefully. You will need to wait until the numbness is entirely gone and the strength has returned. You should be able to move your leg and foot normally before you try and bear weight or walk. You will need someone to be with you when you first try to ensure you do not fall and possibly risk injury.  4. Bruising and tenderness at the needle site are common side effects and will resolve in a few days.  5. Persistent numbness or new problems with movement should be communicated to the surgeon or the [facility] [phone] [facility] [phone]).

## 2023-11-29 NOTE — Anesthesia Procedure Notes (Signed)
 Procedure Name: Intubation Date/Time: 11/29/2023 9:43 AM  Performed by: Burnard Rosaline HERO, CRNAPre-anesthesia Checklist: Patient identified, Emergency Drugs available, Suction available and Patient being monitored Patient Re-evaluated:Patient Re-evaluated prior to induction Oxygen Delivery Method: Circle system utilized Preoxygenation: Pre-oxygenation with 100% oxygen Induction Type: IV induction Ventilation: Mask ventilation without difficulty Laryngoscope Size: Mac and 4 Grade View: Grade I Tube type: Oral Tube size: 7.5 mm Number of attempts: 1 Airway Equipment and Method: Stylet and Oral airway Placement Confirmation: ETT inserted through vocal cords under direct vision, positive ETCO2, breath sounds checked- equal and bilateral and CO2 detector Secured at: 23 cm Tube secured with: Tape Dental Injury: Teeth and Oropharynx as per pre-operative assessment

## 2023-11-29 NOTE — Anesthesia Preprocedure Evaluation (Signed)
 Anesthesia Evaluation  Patient identified by MRN, date of birth, ID band Patient awake    Reviewed: Allergy & Precautions, NPO status , Patient's Chart, lab work & pertinent test results  Airway Mallampati: II  TM Distance: >3 FB Neck ROM: Full    Dental  (+) Dental Advisory Given   Pulmonary Current Smoker and Patient abstained from smoking.   breath sounds clear to auscultation       Cardiovascular hypertension,  Rhythm:Regular Rate:Normal     Neuro/Psych    Depression    negative neurological ROS     GI/Hepatic negative GI ROS,,,(+)     substance abuse  marijuana use  Endo/Other  negative endocrine ROS    Renal/GU negative Renal ROS     Musculoskeletal   Abdominal   Peds  Hematology negative hematology ROS (+)   Anesthesia Other Findings   Reproductive/Obstetrics                              Anesthesia Physical Anesthesia Plan  ASA: 1  Anesthesia Plan: General   Post-op Pain Management: Regional block*, Tylenol PO (pre-op)* and Gabapentin PO (pre-op)*   Induction: Intravenous  PONV Risk Score and Plan: 1 and Dexamethasone, Ondansetron, Treatment may vary due to age or medical condition and Midazolam  Airway Management Planned: Oral ETT  Additional Equipment: None  Intra-op Plan:   Post-operative Plan: Extubation in OR  Informed Consent: I have reviewed the patients History and Physical, chart, labs and discussed the procedure including the risks, benefits and alternatives for the proposed anesthesia with the patient or authorized representative who has indicated his/her understanding and acceptance.     Dental advisory given  Plan Discussed with:   Anesthesia Plan Comments:         Anesthesia Quick Evaluation

## 2023-11-29 NOTE — Anesthesia Procedure Notes (Addendum)
 Anesthesia Regional Block: Interscalene brachial plexus block   Pre-Anesthetic Checklist: , timeout performed,  Correct Patient, Correct Site, Correct Laterality,  Correct Procedure, Correct Position, site marked,  Risks and benefits discussed,  Surgical consent,  Pre-op evaluation,  At surgeon's request and post-op pain management  Laterality: Left  Prep: chloraprep       Needles:  Injection technique: Single-shot  Needle Type: Echogenic Needle     Needle Length: 9cm  Needle Gauge: 21     Additional Needles:   Procedures:,,,, ultrasound used (permanent image in chart),,    Narrative:  Start time: 11/29/2023 8:20 AM End time: 11/29/2023 8:27 AM Injection made incrementally with aspirations every 5 mL.  Performed by: Personally  Anesthesiologist: Epifanio Charleston, MD

## 2023-11-30 ENCOUNTER — Encounter (HOSPITAL_BASED_OUTPATIENT_CLINIC_OR_DEPARTMENT_OTHER): Payer: Self-pay | Admitting: Orthopaedic Surgery

## 2023-11-30 NOTE — Anesthesia Postprocedure Evaluation (Signed)
 Anesthesia Post Note  Patient: [name]  Procedure(s) Performed: REPAIR, SHOULDER, ARTHROSCOPIC, BANKART (Left: Shoulder)     Patient location during evaluation: PACU Anesthesia Type: General Level of consciousness: awake and alert Pain management: pain level controlled Vital Signs Assessment: post-procedure vital signs reviewed and stable Respiratory status: spontaneous breathing, nonlabored ventilation, respiratory function stable and patient connected to nasal cannula oxygen Cardiovascular status: blood pressure returned to baseline and stable Postop Assessment: no apparent nausea or vomiting Anesthetic complications: no   No notable events documented.  Last Vitals:  Vitals:   11/29/23 1100 11/29/23 1116  BP: 116/83 122/79  Pulse: 73 81  Resp: 10 20  Temp:  36.5 C  SpO2: 99% 97%    Last Pain:  Vitals:   11/29/23 1116  TempSrc: Temporal  PainSc: 0-No pain   Pain Goal: Patients Stated Pain Goal: 4 (11/29/23 0730)                 [name]
